# Patient Record
Sex: Female | Born: 1945 | Race: White | Hispanic: No | Smoking: Current every day smoker
Health system: Southern US, Community
[De-identification: ages and names within clinical notes are randomized; demographics above are authoritative.]

## PROBLEM LIST (undated history)

## (undated) DIAGNOSIS — M810 Age-related osteoporosis without current pathological fracture: Secondary | ICD-10-CM

## (undated) DIAGNOSIS — M1990 Unspecified osteoarthritis, unspecified site: Secondary | ICD-10-CM

## (undated) DIAGNOSIS — J449 Chronic obstructive pulmonary disease, unspecified: Secondary | ICD-10-CM

---

## 1998-01-05 ENCOUNTER — Other Ambulatory Visit: Admission: RE | Admit: 1998-01-05 | Discharge: 1998-01-05 | Payer: Self-pay | Admitting: Internal Medicine

## 1998-09-09 ENCOUNTER — Encounter: Payer: Self-pay | Admitting: Neurosurgery

## 1998-09-09 ENCOUNTER — Ambulatory Visit (HOSPITAL_COMMUNITY): Admission: RE | Admit: 1998-09-09 | Discharge: 1998-09-09 | Payer: Self-pay | Admitting: Neurosurgery

## 1999-05-20 ENCOUNTER — Encounter: Payer: Self-pay | Admitting: Internal Medicine

## 1999-05-20 ENCOUNTER — Ambulatory Visit (HOSPITAL_COMMUNITY): Admission: RE | Admit: 1999-05-20 | Discharge: 1999-05-20 | Payer: Self-pay | Admitting: Internal Medicine

## 1999-08-31 ENCOUNTER — Other Ambulatory Visit: Admission: RE | Admit: 1999-08-31 | Discharge: 1999-08-31 | Payer: Self-pay | Admitting: Internal Medicine

## 2000-04-14 ENCOUNTER — Ambulatory Visit (HOSPITAL_COMMUNITY): Admission: RE | Admit: 2000-04-14 | Discharge: 2000-04-14 | Payer: Self-pay | Admitting: Cardiology

## 2000-04-14 ENCOUNTER — Encounter: Payer: Self-pay | Admitting: Cardiology

## 2000-06-20 ENCOUNTER — Encounter: Payer: Self-pay | Admitting: Internal Medicine

## 2000-06-20 ENCOUNTER — Inpatient Hospital Stay (HOSPITAL_COMMUNITY): Admission: AD | Admit: 2000-06-20 | Discharge: 2000-06-22 | Payer: Self-pay | Admitting: Internal Medicine

## 2000-06-22 ENCOUNTER — Encounter: Payer: Self-pay | Admitting: Cardiology

## 2000-08-23 ENCOUNTER — Emergency Department (HOSPITAL_COMMUNITY): Admission: EM | Admit: 2000-08-23 | Discharge: 2000-08-23 | Payer: Self-pay | Admitting: Emergency Medicine

## 2000-08-23 ENCOUNTER — Encounter: Payer: Self-pay | Admitting: Emergency Medicine

## 2000-08-24 ENCOUNTER — Inpatient Hospital Stay: Admission: AD | Admit: 2000-08-24 | Discharge: 2000-08-28 | Payer: Self-pay | Admitting: Internal Medicine

## 2001-10-31 ENCOUNTER — Ambulatory Visit (HOSPITAL_COMMUNITY): Admission: RE | Admit: 2001-10-31 | Discharge: 2001-10-31 | Payer: Self-pay | Admitting: Pain Medicine

## 2001-10-31 ENCOUNTER — Encounter: Payer: Self-pay | Admitting: Pain Medicine

## 2002-03-06 ENCOUNTER — Encounter: Admission: RE | Admit: 2002-03-06 | Discharge: 2002-03-06 | Payer: Self-pay | Admitting: Internal Medicine

## 2002-03-06 ENCOUNTER — Encounter: Payer: Self-pay | Admitting: Internal Medicine

## 2002-04-17 ENCOUNTER — Ambulatory Visit (HOSPITAL_COMMUNITY): Admission: RE | Admit: 2002-04-17 | Discharge: 2002-04-17 | Payer: Self-pay | Admitting: Gastroenterology

## 2002-04-17 ENCOUNTER — Encounter (INDEPENDENT_AMBULATORY_CARE_PROVIDER_SITE_OTHER): Payer: Self-pay | Admitting: Specialist

## 2002-10-11 ENCOUNTER — Other Ambulatory Visit: Admission: RE | Admit: 2002-10-11 | Discharge: 2002-10-11 | Payer: Self-pay | Admitting: Obstetrics and Gynecology

## 2002-12-16 ENCOUNTER — Emergency Department (HOSPITAL_COMMUNITY): Admission: EM | Admit: 2002-12-16 | Discharge: 2002-12-16 | Payer: Self-pay | Admitting: Emergency Medicine

## 2003-01-31 ENCOUNTER — Emergency Department (HOSPITAL_COMMUNITY): Admission: EM | Admit: 2003-01-31 | Discharge: 2003-01-31 | Payer: Self-pay | Admitting: Emergency Medicine

## 2003-01-31 ENCOUNTER — Encounter: Payer: Self-pay | Admitting: Emergency Medicine

## 2003-02-17 ENCOUNTER — Ambulatory Visit (HOSPITAL_COMMUNITY): Admission: RE | Admit: 2003-02-17 | Discharge: 2003-02-17 | Payer: Self-pay | Admitting: Cardiology

## 2003-02-17 ENCOUNTER — Encounter: Payer: Self-pay | Admitting: Cardiology

## 2003-07-08 ENCOUNTER — Encounter: Admission: RE | Admit: 2003-07-08 | Discharge: 2003-07-08 | Payer: Self-pay | Admitting: Internal Medicine

## 2003-08-01 ENCOUNTER — Inpatient Hospital Stay (HOSPITAL_COMMUNITY): Admission: EM | Admit: 2003-08-01 | Discharge: 2003-08-01 | Payer: Self-pay | Admitting: Emergency Medicine

## 2004-06-20 ENCOUNTER — Ambulatory Visit (HOSPITAL_COMMUNITY): Admission: RE | Admit: 2004-06-20 | Discharge: 2004-06-20 | Payer: Self-pay | Admitting: Pain Medicine

## 2004-06-25 ENCOUNTER — Encounter: Admission: RE | Admit: 2004-06-25 | Discharge: 2004-06-25 | Payer: Self-pay | Admitting: Internal Medicine

## 2004-07-13 ENCOUNTER — Encounter: Admission: RE | Admit: 2004-07-13 | Discharge: 2004-07-13 | Payer: Self-pay | Admitting: Internal Medicine

## 2004-09-11 ENCOUNTER — Emergency Department (HOSPITAL_COMMUNITY): Admission: EM | Admit: 2004-09-11 | Discharge: 2004-09-11 | Payer: Self-pay | Admitting: Emergency Medicine

## 2004-12-30 ENCOUNTER — Encounter: Admission: RE | Admit: 2004-12-30 | Discharge: 2004-12-30 | Payer: Self-pay | Admitting: Neurology

## 2005-01-12 ENCOUNTER — Ambulatory Visit (HOSPITAL_COMMUNITY): Admission: RE | Admit: 2005-01-12 | Discharge: 2005-01-13 | Payer: Self-pay | Admitting: Neurology

## 2005-01-20 ENCOUNTER — Encounter: Payer: Self-pay | Admitting: Interventional Radiology

## 2005-02-17 ENCOUNTER — Inpatient Hospital Stay (HOSPITAL_COMMUNITY): Admission: RE | Admit: 2005-02-17 | Discharge: 2005-02-18 | Payer: Self-pay | Admitting: Interventional Radiology

## 2005-06-13 ENCOUNTER — Ambulatory Visit (HOSPITAL_COMMUNITY): Admission: RE | Admit: 2005-06-13 | Discharge: 2005-06-13 | Payer: Self-pay | Admitting: Interventional Radiology

## 2005-08-08 ENCOUNTER — Encounter: Admission: RE | Admit: 2005-08-08 | Discharge: 2005-08-08 | Payer: Self-pay | Admitting: Internal Medicine

## 2006-02-13 ENCOUNTER — Ambulatory Visit (HOSPITAL_COMMUNITY): Admission: RE | Admit: 2006-02-13 | Discharge: 2006-02-13 | Payer: Self-pay | Admitting: Interventional Radiology

## 2006-06-05 ENCOUNTER — Ambulatory Visit (HOSPITAL_COMMUNITY): Admission: RE | Admit: 2006-06-05 | Discharge: 2006-06-05 | Payer: Self-pay | Admitting: Interventional Radiology

## 2006-11-29 ENCOUNTER — Ambulatory Visit (HOSPITAL_COMMUNITY): Admission: RE | Admit: 2006-11-29 | Discharge: 2006-11-29 | Payer: Self-pay | Admitting: Interventional Radiology

## 2008-02-10 ENCOUNTER — Emergency Department (HOSPITAL_COMMUNITY): Admission: EM | Admit: 2008-02-10 | Discharge: 2008-02-10 | Payer: Self-pay | Admitting: Emergency Medicine

## 2008-09-29 ENCOUNTER — Ambulatory Visit (HOSPITAL_COMMUNITY): Admission: RE | Admit: 2008-09-29 | Discharge: 2008-09-29 | Payer: Self-pay | Admitting: Interventional Radiology

## 2008-10-08 ENCOUNTER — Ambulatory Visit: Payer: Self-pay | Admitting: Vascular Surgery

## 2008-10-08 ENCOUNTER — Ambulatory Visit (HOSPITAL_COMMUNITY): Admission: RE | Admit: 2008-10-08 | Discharge: 2008-10-08 | Payer: Self-pay | Admitting: Interventional Radiology

## 2008-10-08 ENCOUNTER — Encounter (INDEPENDENT_AMBULATORY_CARE_PROVIDER_SITE_OTHER): Payer: Self-pay | Admitting: Interventional Radiology

## 2008-12-02 ENCOUNTER — Encounter: Admission: RE | Admit: 2008-12-02 | Discharge: 2008-12-02 | Payer: Self-pay | Admitting: Gastroenterology

## 2009-04-11 ENCOUNTER — Emergency Department (HOSPITAL_COMMUNITY): Admission: EM | Admit: 2009-04-11 | Discharge: 2009-04-11 | Payer: Self-pay | Admitting: Emergency Medicine

## 2009-11-30 ENCOUNTER — Ambulatory Visit (HOSPITAL_COMMUNITY): Admission: RE | Admit: 2009-11-30 | Discharge: 2009-11-30 | Payer: Self-pay | Admitting: Interventional Radiology

## 2009-12-12 ENCOUNTER — Emergency Department (HOSPITAL_COMMUNITY): Admission: EM | Admit: 2009-12-12 | Discharge: 2009-12-12 | Payer: Self-pay | Admitting: Emergency Medicine

## 2010-06-26 ENCOUNTER — Encounter: Payer: Self-pay | Admitting: Internal Medicine

## 2010-06-27 ENCOUNTER — Encounter: Payer: Self-pay | Admitting: Nephrology

## 2010-06-27 ENCOUNTER — Encounter: Payer: Self-pay | Admitting: Interventional Radiology

## 2010-06-27 ENCOUNTER — Encounter: Payer: Self-pay | Admitting: Internal Medicine

## 2010-06-28 ENCOUNTER — Encounter: Payer: Self-pay | Admitting: Vascular Surgery

## 2010-08-10 ENCOUNTER — Institutional Professional Consult (permissible substitution): Payer: Self-pay | Admitting: Internal Medicine

## 2010-08-18 ENCOUNTER — Institutional Professional Consult (permissible substitution): Payer: Self-pay | Admitting: Internal Medicine

## 2010-08-23 LAB — CREATININE, SERUM
Creatinine, Ser: 1.03 mg/dL (ref 0.4–1.2)
GFR calc Af Amer: >60 mL/min (ref >60)

## 2010-09-06 ENCOUNTER — Other Ambulatory Visit: Payer: Self-pay | Admitting: Adult Health Nurse Practitioner

## 2010-09-06 DIAGNOSIS — R229 Localized swelling, mass and lump, unspecified: Secondary | ICD-10-CM

## 2010-09-06 DIAGNOSIS — M545 Low back pain: Secondary | ICD-10-CM

## 2010-09-07 ENCOUNTER — Ambulatory Visit
Admission: RE | Admit: 2010-09-07 | Discharge: 2010-09-07 | Disposition: A | Discharge status: Home / Self Care | Payer: Medicare Other | Source: Ambulatory Visit | Attending: Adult Health Nurse Practitioner | Admitting: Adult Health Nurse Practitioner

## 2010-09-07 DIAGNOSIS — M545 Low back pain: Secondary | ICD-10-CM

## 2010-09-07 DIAGNOSIS — R229 Localized swelling, mass and lump, unspecified: Secondary | ICD-10-CM

## 2010-09-15 LAB — BASIC METABOLIC PANEL
BUN: 11 mg/dL (ref 6–23)
Calcium: 8.8 mg/dL (ref 8.4–10.5)
Chloride: 106 mEq/L (ref 96–112)
Potassium: 3.9 mEq/L (ref 3.5–5.1)
Sodium: 139 mEq/L (ref 135–145)

## 2010-09-15 LAB — CBC
HCT: 34.8 % — ABNORMAL LOW (ref 36.0–46.0)
Platelets: 217 10*3/uL (ref 150–400)
RDW: 13.4 % (ref 11.5–15.5)
WBC: 5.5 10*3/uL (ref 4.0–10.5)

## 2010-09-15 LAB — APTT: aPTT: 24 seconds (ref 24–37)
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
## 2010-10-22 NOTE — Op Note (Signed)
NAME:  [name] [name], [name] [name] NO.:  [id number]   MEDICAL RECORD NO.:  [id number]                    PATIENT TYPE:   LOCATION:                                       FACILITY:   PHYSICIAN:  Anselmo Rod, M.D.               DATE OF BIRTH:  1945-12-16   DATE OF PROCEDURE:  04/17/2002  DATE OF DISCHARGE:                                 OPERATIVE REPORT   PROCEDURE:  Colonoscopy with snare polypectomy times three.   ENDOSCOPIST:  Anselmo Rod, M.D.   INSTRUMENT USED:  Olympus video colonoscope (adjustable pediatric scope).   INDICATIONS FOR PROCEDURE:  Guaiac positive stools, abnormal weight loss and  ongoing constipation in a 65 year-old white female with a Alcantar-standing  history of narcotic use secondary to back pain and injured vertebral disk.  Rule out colonic polyps, AVMs, masses or hemorrhoids.   PREPROCEDURE PREPARATION:  Informed consent was procured from the patient.  The patient was fasted for eight hours prior to the procedure.  She had a  bottle of MiraLax and two 32 ounce bottles of Gator-Ade prior to the  procedure.   PREPROCEDURE PHYSICAL EXAM:  VITAL SIGNS:  The patient had stable vital  signs.  NECK:  Supple.  CHEST:  Clear to auscultation.  HEART:  S1 and S2.  ABDOMEN:  Soft with normal bowel sounds.   DESCRIPTION OF PROCEDURE:  The patient was placed in the left lateral  decubitus position and sedated was used with 30 mg of Demerol and 1 mg of  Versed intravenously.  Once the patient was adequately sedated and  maintained on low flow oxygen and continuous cardiac monitoring, the Olympus  video colonoscope was advanced into the rectum to the cecum.  The patient  required large doses of medications.  She had an esophagogastroduodenoscopy  prior to the colonoscopy and total of 180 mg of Demerol and 18 mg of Versed  for both the EGD and colonoscopy.  The procedure was completed up to the  terminal ileum which appeared normal.  The  appendiceal and ileocecal valve  were clearly visualized and photographed.  Three polyps were snared; two  from the rectosigmoid and one in the retrosigmoid area.  One was  pedunculated and one was sessile.  One was snared from 60 cm which was a  sessile polyp.  There was no evidence of diverticulosis.  Small internal  hemorrhoids were seen on retroflexion.  The patient tolerated the procedure  without complications.   IMPRESSION:  1. Three colonic polyps (see description above).  2. No evidence of diverticulosis.  3. Small non-bleeding internal hemorrhoids seen on retroflexion.    RECOMMENDATIONS:  1. Await pathology results.  2. Avoid all non-steroidals including aspirin for the next four weeks.  3. Outpatient follow-up in the next two weeks for further recommendations.  Anselmo Rod, M.D.    JNM/MEDQ  D:  04/17/2002  T:  04/17/2002  Job:  [id number]   cc:   Merlene Laughter. Renae Gloss, M.D.
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
## 2010-10-22 NOTE — Cardiovascular Report (Signed)
NAME:  [name] [name], [name] [name]                            ACCOUNT NO.:  [id number]   MEDICAL RECORD NO.:  [id number]                   PATIENT TYPE:  INP   LOCATION:  2017                                 FACILITY:  [facility]   PHYSICIAN:  Mohan N. Sharyn Lull, M.D.              DATE OF BIRTH:  [birth year]   DATE OF PROCEDURE:  08/01/2003  DATE OF DISCHARGE:  08/01/2003                              CARDIAC CATHETERIZATION   PROCEDURE:  1. Left cardiac cath with selected left and right coronary angiography.  2. Left ventriculography via right groin using Judkins technique.   INDICATION FOR THE PROCEDURE:  [name] [name] is a 65 year old white female with  past medical history significant for hypertension, emphysema, tobacco abuse,  history of fibromyalgia, history of peptic ulcer disease and GERD, family  history of coronary artery disease, depression, degenerative joint disease.  She came to the ER complaining of squeezing chest pain radiating to the left  arm and back, grade 9/10, received 2 sublingual nitro in ER with relief of  chest pain.  The patient also complains of exertional dyspnea with minimal  exertion and feeling weak and tired and fatigued.  Denies PND, orthopnea,  leg swelling, denies palpitation, lightheadedness, or syncope.  Patient also  complains of chronic back pain and is on multiple narcotics.   PAST MEDICAL HISTORY:  1. As above.  2. Recent pneumonia approximately 2-3 weeks ago.   PAST SURGICAL HISTORY:  1. Hernia surgery in the past.  2. Tonsillectomy many years ago.  3. Hysterectomy in the past.   ALLERGIES:  She is allergic to PENICILLIN.   MEDICATION AT HOME:  1. Fentanyl patch 50 mcg per 72 hours.  2. Oxycodone.  3. Neurontin.  4. Xanax.  5. Zanaflex.  6. Nexium.  7. Trazodone.  8. Advair.  9. Nitrostat sublingual p.r.n.   SOCIAL HISTORY:  She is widowed, retired housewife, smokes 1-1/2 pack-per-  day for 36 years and continues to smoke.  No history of alcohol  abuse.  Father died of CVA.  He was hypertensive.  He also had cancer at the age of  59.  Mother died at the age of 65, and she was hypertensive.  She also had  stroke.  One brother had MI in his 62s.  One sister had cancer.   PHYSICAL EXAMINATION:  GENERAL:  She is alert, awake, oriented x 3, in no  acute distress.  VITAL SIGNS:  Blood pressure is 112/72, pulse 76 regular.  HEENT:  Conjunctivae pink.  NECK:  Supple, no JVD, no bruit.  LUNGS:  Clear to auscultation without rhonchi or rales.  CARDIOVASCULAR:  S1, S2 normal.  There is soft systolic murmur, and there  was no [place] gallop.  ABDOMEN:  Soft.  Bowel sounds are present, nontender.  EXTREMITIES:  There is no cyanosis, clubbing, or edema.   Her EKG done in the ER showed normal sinus rhythm with  no acute ischemic  changes.  Repeat EKG showed nonspecific T-wave changes.  Her three sets of  cardiac enzymes were negative.  The patient was started on subcu Lovenox and  nitrates.  The patient had not had any further episodes of chest pain during  the hospital stay.  Discussed with patient and her daughter regarding  various options of treatment, i.e., noninvasive stress testing versus left  cath, possible PTCA, stenting, and risks of death, MI, stroke, need for  emergency CABG, risk of restenosis, local vascular complications, etc., and  consented for PCI.   DESCRIPTION OF PROCEDURE:  After obtaining the informed consent, patient was  brought to the cath lab and was placed on fluoroscopy table.  Right groin  was prepped and draped in usual fashion.  Then 2% Xylocaine was used for  local anesthesia in the right groin.  Thin-wall needle and 6 French arterial  sheath was placed.  The sheath was aspirated and flushed.  Next, 6 French  left Judkins catheter was advanced over the wire under fluoroscopic guidance  up to the ascending aorta.  Wire was pulled out.  The catheter was aspirated  and connected to the manifold.  Catheter was further  advanced and engaged  into left coronary ostium.  Multiple views of the left system were taken.  Next, the catheter was disengaged and was pulled out over the wire and was  replaced with 6 French right Judkins catheter which was advanced over the  wire under fluoroscopic guidance up to the ascending aorta.  Wire was pulled  out.  The catheter was aspirated and connected to the manifold.  The  catheter was further advanced and engaged into right coronary ostium.  Multiple views of the right system were taken.  Next, the catheter was  disengaged and was pulled out over the wire and was replaced with 6 French  pigtail catheter which was advanced over the wire under fluoroscopic  guidance up to the ascending aorta.  Wire was pulled out; the catheter was  aspirated and connected to the manifold.  The catheter was further advanced  across the aortic valve into the LV.  LV pressures were recorded.  Next,  left ventriculography was done in 30-degree RAO position.  Post angiographic  pressures were recorded from LV, and then pull-back pressures were recorded  from the aorta.  There was no gradient across the aortic valve.  Next, the  pigtail catheter was pulled out over the wire.  Sheaths were aspirated and  flushed.   FINDINGS:  1. LV showed good LV systolic function.  2. Left main was patent.  3. LAD has 10-15% mid stenosis.  4. Diagonal 1 was small which was patent.  5. Diagonal 2 was very small which was patent.  6. Ramus was very small which was also patent.  7. Left circumflex was patent.  OM 1-2, OM 3 were patent.  RCA was patent.   Arteriotomy was closed with Perclose without complications.  The patient  tolerated the procedure well.  The patient was transferred to recovery room  in stable condition.                                               Eduardo Osier. Sharyn Lull, M.D.   MNH/MEDQ  D:  08/01/2003  T:  08/04/2003  Job:  [id number]   cc:  Anselmo Rod, M.D.  [address]  [address]  [place]  [place] [address]  Fax: [phone]   Olene Craven, M.D.  [address]  [address]  [place]  [place] [address]  Fax: [phone]   Cath Lab

## 2010-10-22 NOTE — Discharge Summary (Signed)
NAME:  [name] [name], [name] [name]                            ACCOUNT NO.:  [id number]   MEDICAL RECORD NO.:  [id number]                   PATIENT TYPE:  INP   LOCATION:  2017                                 FACILITY:  [facility]   PHYSICIAN:  Mohan N. Sharyn Lull, M.D.              DATE OF BIRTH:  04/07/1946   DATE OF ADMISSION:  07/31/2003  DATE OF DISCHARGE:  08/01/2003                                 DISCHARGE SUMMARY   ADMITTING DIAGNOSES:  1. Recurrent chest pain, rule out myocardial infarction.  2. Hypertension.  3. Emphysema.  4. Tobacco abuse.  5. Degenerative joint disease.  6. Depression.  7. Positive family history of coronary artery disease.  8. History of peptic ulcer disease.  9. Gastroesophageal reflux disease.  10.      Fibromyalgia.  11.      Chronic back pain.   DISCHARGE DIAGNOSES:  1. Status post recurrent chest pain.  2. Mild coronary artery disease.  3. Hypertension.  4. Emphysema.  5. Tobacco abuse.  6. Degenerative joint disease.  7. Depression.  8. Positive family history of coronary artery disease.  9. Peptic ulcer disease.  10.      History of gastroesophageal reflux disease.  11.      Fibromyalgia.  12.      Chronic back pain.   DISCHARGE HOME MEDICATIONS:  1. Tiazac 120 mg p.o. q.a.m.  2. Nitrostat sublingual p.r.n.  3. Nexium 40 mg p.o. b.i.d.  4. Trazodone.  5. Neurontin.  6. Oxycodone.  7. Fentanyl patch as before.  (The patient has been advised to wean off the narcotics, to which she and  her daughter agree.)   DIET:  Low salt, low cholesterol.   ACTIVITY:  Avoid heavy lifting, pushing, or pulling for 48 hours post  cardiac catheterization, and Perclose instructions have been given.   FOLLOW UP:  1. Follow with me in 1 week.  2. Follow with primary M.D. and Dr. Loreta Ave as scheduled.   BRIEF HISTORY AND HOSPITAL COURSE:  [name] [name] is a 65 year old white female  with a past medical history significant for hypertension, emphysema, tobacco  abuse,  fibromyalgia, peptic ulcer disease, GERD, positive family history of  coronary artery disease, depression.  She came to the ER complaining of  squeezing chest pain radiating to the left arm and back, grade 9/10.  She  received sublingual nitroglycerin x2 in the emergency room with relief of  chest pain.  The patient also complains of exertional dyspnea with minimal  exertion, and feeling tired and fatigued.  Denies PND, orthopnea, leg  swelling.  Denies palpitations, lightheadedness, or syncope.  The patient  also complaint of chronic back pain.  Denies any relation of chest pain to  coughing.  The patient states that she had pneumonia approximately 2-3 weeks  ago.   PAST MEDICAL HISTORY:  As above.   PAST SURGICAL HISTORY:  1. She had  herniated disk surgery in the past.  2. Tonsillectomy in the past.  3. Hysterectomy in the past.   ALLERGIES:  No known drug allergies.   MEDICATIONS:  She is on multiple medications -  1. Fentanyl.  2. Oxycodone.  3. Neurontin.  4. Xanax.  5. Zanaflex.  6. Nexium.  7. Trazodone.  8. Advair.  9. Nitrostat sublingual p.r.n.   SOCIAL HISTORY:  She is a widowed retired housewife.  She smokes 1-1/2 packs  per day for 36 years.  No history of alcohol abuse.   FAMILY HISTORY:  Father died of stroke.  He was hypertensive.  He had also  cancer, and died at the age of 83.  Mother died at the age of 46.  She was  hypertensive.  She also had a stroke.  One brother had an MI in his 2's.  One sister had cancer.   PHYSICAL EXAMINATION:  GENERAL:  She was alert, awake, oriented x3, in no  acute distress.  VITAL SIGNS:  Blood pressure was 112/72, pulse 76, conjunctivae pink.  NECK:  No JVD, no bruit.  LUNGS:  Clear to auscultation without rhonchi, rales, or wheezing.  CARDIOVASCULAR:  S1 and S2 were normal.  There was no S3, gallop.  There was  a soft systolic murmur.  ABDOMEN:  Soft.  Bowel sounds were present.  Nontender.  EXTREMITIES:  There is no  clubbing, cyanosis, or edema.   LABORATORY DATA:  Sodium 138, potassium 3.6, chloride 104, BUN 10, glucose  98, creatinine 1.0, hemoglobin 11.7, hematocrit 34.8.  Three sets of cardiac  enzymes point of care were negative.  EKG showed normal sinus rhythm.  Repeat EKG showed nonspecific T wave changes.   BRIEF HOSPITAL COURSE:  The patient was admitted to the telemetry unit.  MI  was ruled out by serial enzymes and EKG.  Discussed with patient and her  daughter regarding various options of treatment (i.e. noninvasive stress  testing versus left catheterization, possible PTCA and stenting, __________,  stroke, need for emergency CABG, risk of restenosis, local vascular  punctations, __________).  The patient opted for invasive therapy.  The  patient underwent left catheterization today, which showed mild coronary  artery disease.  The patient did not have any further episodes of chest pain  during the hospital stay.  Her groin is stable.  The patient will be  ambulated in the hallway later this evening if her groin is stable, and, if  she remains hemodynamically stable, will be discharged home on the above  medications, and will be followed up in my office in 1 week.                                                Eduardo Osier. Sharyn Lull, M.D.    MNH/MEDQ  D:  08/01/2003  T:  08/02/2003  Job:  [id number]   cc:   Anselmo Rod, M.D.  [address]  [address]  [place]  [place] [address]  Fax: [phone]   Olene Craven, M.D.  [address]  [address]  [place]  [place] [address]  Fax: [phone]
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
## 2010-10-22 NOTE — Consult Note (Signed)
NAME:  [name] [name], [name] [name]                  ACCOUNT NO.:  [id number]   MEDICAL RECORD NO.:  [id number]           PATIENT TYPE:   LOCATION:                               FACILITY:  [facility]   PHYSICIAN:  Sanjeev K. Deveshwar, M.D.DATE OF BIRTH:  [birth year]   DATE OF CONSULTATION:  01/20/2005  DATE OF DISCHARGE:                                   CONSULTATION   BRIEF HISTORY:  This is a pleasant 65 year old female who was evaluated by  Dr. Orlin Hilding in July of this year for chronic pain.  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  The  patient was referred to Dr. 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  There was no other aneurysm noted.  The patient returns today  accompanied by her sister to discuss possible treatment options for the  aneurysm.   Dr. Corliss Skains spent greater than 40 minutes with the patient and her sister  discussing treatment options, as well as risks and benefits.  Dr. Corliss Skains  did feel that the aneurysm would be amenable to coiling.  He also discussed  other options such as watching and waiting, as well as possible open  surgical clipping to be performed by a neurosurgeon.  Apparently, Dr.  Orlin Hilding had suggested to the patient that she obtain a second opinion from a  neurosurgeon; however, the patient apparently had declined neurosurgical  consultation.   The patient's arteriogram study was reviewed with the patient and her  sister, and the patient stated that she would like to proceed with coiling  of the aneurysm as soon as possible.  Dr. Corliss Skains recommended that the  patient try to quit smoking as soon as possible, and to be sure that her  blood pressure is under optimal control.  He felt that the aneurysm coiling  could probably be performed in early September.  The patient  will be  contacted tomorrow with possible dates for the intervention.   Again, greater than 40 minutes were spent with the patient and her sister  today discussing options, risks, and benefits of the procedures.      Delton See, P.A.    ______________________________  Grandville Silos. Corliss Skains, M.D.    DR/MEDQ  D:  01/20/2005  T:  01/20/2005  Job:  [id number]   cc:   Santina Evans A. Orlin Hilding, M.D.  Fax: [phone]   Ewing Schlein  [address]  [place]  [place] [address]  Fax: [phone]   Merlene Laughter. Renae Gloss, M.D.  [address]  [address]  [place]  [place] [address]  Fax: [phone]
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
## 2010-10-22 NOTE — Op Note (Signed)
   NAME:  [name] [name], [name] [name]                         ACCOUNT NO.:  [id number]   MEDICAL RECORD NO.:  [id number]                   PATIENT TYPE:  AMB   LOCATION:  ENDO                                 FACILITY:  [facility]   PHYSICIAN:  Anselmo Rod, M.D.               DATE OF BIRTH:  [birth year]   DATE OF PROCEDURE:  04/17/2002  DATE OF DISCHARGE:                                 OPERATIVE REPORT   PROCEDURE PERFORMED:  Esophagogastroduodenoscopy.   ENDOSCOPIST:  Charna Elizabeth, M.D.   INSTRUMENT USED:  Olympus video panendoscope.   INDICATIONS FOR PROCEDURE:  Guaiac positive stool and abnormal weight loss  in a 65 year-old white female.  Rule out ulcer disease, esophagitis,  gastritis, masses, polyps, etc.   PREPROCEDURE PREPARATION:  Informed consent was procured from the patient.  The patient was fasted for eight hours prior to the procedure.   PREPROCEDURE PHYSICAL:  The patient had stable vital signs.  Neck supple,  chest clear to auscultation.  S1, S2 regular.  Abdomen soft with normal  bowel sounds.   DESCRIPTION OF PROCEDURE:  The patient was placed in the left lateral  decubitus position and sedated with 150 mg of Demerol and 17 mg of Versed  intravenously.  Once the patient was adequately sedated and maintained on  low-flow oxygen and continuous cardiac monitoring, the Olympus video  panendoscope was advanced through the mouth piece over the tongue into the  esophagus under direct vision.  The entire esophagus appeared normal with no  evidence of ring, stricture, masses, esophagitis or Barrett's mucosa.  The  scope was then advanced to the stomach.  There was mild diffuse gastritis  seen throughout the gastric mucosa.  No  hiatal hernia was noted on  retroflexion.  The proximal small bowel appeared normal.   IMPRESSION:  1. Essentially unrevealing esophagogastroduodenoscopy with regard to     patient's guaiac positive stool and abnormal weight loss.  2. Mild diffuse gastritis,  no ulcers seen.   RECOMMENDATIONS:  Proceed with colonoscopy at this time.                                                 Anselmo Rod, M.D.    JNM/MEDQ  D:  04/17/2002  T:  04/17/2002  Job:  [id number]   cc:   Merlene Laughter. Renae Gloss, M.D.

## 2010-10-22 NOTE — Discharge Summary (Signed)
[facility]  Patient:    [name] [name], [name] [name]                         MRN: [id number] Adm. Date:  [id number] Disc. Date: [id number] Attending:  Devoria Albe CC:         Adelene Amas. Williford, M.D.   Discharge Summary  DATE OF BIRTH:  1945/11/06  DISCHARGE DIAGNOSES: 1. Major depression with psychotic features, resolved. 2. Chronic obstructive pulmonary disease. 3. Chronic pain syndrome.  CONSULTATIONS:  Dr. Jeanie Sewer, psychiatry.  PROCEDURES:  CT scan of the head - negative.  DISCHARGE MEDICATIONS: 1. Risperdal 1 mg p.o. q.h.s. 2. Prozac 10 mg q.d. x 1 week then 20 mg p.o. q.d. 3. Neurontin 300 mg p.o. b.i.d. 4. Duragesic patch 50 mcg one patch q.3d. 5. Premarin 1.25 mg p.o. q.d.  HOSPITAL COURSE:  The patient was admitted on August 24, 2000, after experiencing a 12-hour history of confusion and delirium, found wandering around the house.  She was admitted for observation.  CT scan of the head was negative.  Initial laboratories were unremarkable.  The patient had loose associations, word salad, and was disoriented to person, place, and time. Psychiatric consult was garnered with Dr. Jeanie Sewer.  The patient was diagnosed with major depression with psychotic features in lieu of severe social stressors including sister with breast cancer undergoing treatment and a daughter who is getting married in the near future with the patient and daughter having an estranged relationship.  The patient was started on Risperdal while in the hospital with good resolution of her psychotic features.  She is being discharged in stable condition with resolution of her psychotic features.  DISCHARGE FOLLOWUP:  She is to follow up with intensive outpatient psychiatric treatment which will be arranged by case management upon discharge.  CONDITION UPON DISCHARGE:  Stable. DD:  08/28/00 TD:  08/28/00 Job: [id number]

## 2010-11-05 ENCOUNTER — Other Ambulatory Visit: Payer: Self-pay | Admitting: Internal Medicine

## 2010-11-05 DIAGNOSIS — R935 Abnormal findings on diagnostic imaging of other abdominal regions, including retroperitoneum: Secondary | ICD-10-CM

## 2010-11-12 ENCOUNTER — Ambulatory Visit
Admission: RE | Admit: 2010-11-12 | Discharge: 2010-11-12 | Disposition: A | Discharge status: Home / Self Care | Payer: Medicare Other | Source: Ambulatory Visit | Attending: Internal Medicine | Admitting: Internal Medicine

## 2010-11-12 DIAGNOSIS — R935 Abnormal findings on diagnostic imaging of other abdominal regions, including retroperitoneum: Secondary | ICD-10-CM

## 2010-11-12 MED ORDER — GADOBENATE DIMEGLUMINE 529 MG/ML IV SOLN
10.0000 mL | Freq: Once | INTRAVENOUS | Status: AC | PRN
  Administered 2010-11-12: 10 mL via INTRAVENOUS

## 2010-11-19 ENCOUNTER — Emergency Department (HOSPITAL_COMMUNITY)
Admission: EM | Admit: 2010-11-19 | Discharge: 2010-11-19 | Disposition: A | Discharge status: Home / Self Care | Payer: Medicare Other | Attending: Emergency Medicine | Admitting: Emergency Medicine

## 2010-11-19 DIAGNOSIS — J449 Chronic obstructive pulmonary disease, unspecified: Secondary | ICD-10-CM | POA: Insufficient documentation

## 2010-11-19 DIAGNOSIS — M810 Age-related osteoporosis without current pathological fracture: Secondary | ICD-10-CM | POA: Insufficient documentation

## 2010-11-19 DIAGNOSIS — G8929 Other chronic pain: Secondary | ICD-10-CM | POA: Insufficient documentation

## 2010-11-19 DIAGNOSIS — J4489 Other specified chronic obstructive pulmonary disease: Secondary | ICD-10-CM | POA: Insufficient documentation

## 2010-11-19 DIAGNOSIS — K219 Gastro-esophageal reflux disease without esophagitis: Secondary | ICD-10-CM | POA: Insufficient documentation

## 2010-11-19 DIAGNOSIS — M25559 Pain in unspecified hip: Secondary | ICD-10-CM | POA: Insufficient documentation

## 2010-12-23 ENCOUNTER — Other Ambulatory Visit (HOSPITAL_COMMUNITY): Payer: Self-pay | Admitting: Interventional Radiology

## 2010-12-23 DIAGNOSIS — I729 Aneurysm of unspecified site: Secondary | ICD-10-CM

## 2011-01-04 ENCOUNTER — Ambulatory Visit (HOSPITAL_COMMUNITY): Payer: Medicare Other

## 2011-01-04 ENCOUNTER — Ambulatory Visit (HOSPITAL_COMMUNITY): Admission: RE | Admit: 2011-01-04 | Payer: Medicare Other | Source: Ambulatory Visit

## 2011-01-21 ENCOUNTER — Ambulatory Visit (HOSPITAL_COMMUNITY): Admission: RE | Admit: 2011-01-21 | Payer: Medicare Other | Source: Ambulatory Visit

## 2011-02-03 ENCOUNTER — Ambulatory Visit (HOSPITAL_COMMUNITY): Payer: Medicare Other

## 2011-02-14 ENCOUNTER — Ambulatory Visit (HOSPITAL_COMMUNITY)
Admission: RE | Admit: 2011-02-14 | Discharge: 2011-02-14 | Disposition: A | Discharge status: Home / Self Care | Payer: Medicare Other | Source: Ambulatory Visit | Attending: Interventional Radiology | Admitting: Interventional Radiology

## 2011-02-14 DIAGNOSIS — I671 Cerebral aneurysm, nonruptured: Secondary | ICD-10-CM | POA: Insufficient documentation

## 2011-02-14 DIAGNOSIS — Z09 Encounter for follow-up examination after completed treatment for conditions other than malignant neoplasm: Secondary | ICD-10-CM | POA: Insufficient documentation

## 2011-02-14 DIAGNOSIS — I729 Aneurysm of unspecified site: Secondary | ICD-10-CM

## 2011-02-14 DIAGNOSIS — G319 Degenerative disease of nervous system, unspecified: Secondary | ICD-10-CM | POA: Insufficient documentation

## 2011-02-14 DIAGNOSIS — I6789 Other cerebrovascular disease: Secondary | ICD-10-CM | POA: Insufficient documentation

## 2011-02-14 LAB — BUN: BUN: 10 mg/dL (ref 6–23)

## 2011-02-14 LAB — CREATININE, SERUM
Creatinine, Ser: 0.82 mg/dL (ref 0.50–1.10)
GFR calc Af Amer: >60 mL/min (ref >60)
GFR calc non Af Amer: >60 mL/min (ref >60)

## 2011-02-14 MED ORDER — GADOBENATE DIMEGLUMINE 529 MG/ML IV SOLN
10.0000 mL | Freq: Once | INTRAVENOUS | Status: AC
  Administered 2011-02-14: 10 mL via INTRAVENOUS

## 2011-03-23 LAB — BASIC METABOLIC PANEL
BUN: 5 — ABNORMAL LOW
Creatinine, Ser: 0.81
GFR calc non Af Amer: >60
Glucose, Bld: 103 — ABNORMAL HIGH

## 2011-03-23 LAB — CBC
Platelets: 272
RDW: 13.3
WBC: 7.4

## 2011-03-23 LAB — APTT: aPTT: 23 — ABNORMAL LOW

## 2011-03-23 LAB — PROTIME-INR: Prothrombin Time: 13.6

## 2011-05-27 ENCOUNTER — Other Ambulatory Visit: Payer: Self-pay | Admitting: Internal Medicine

## 2011-05-27 DIAGNOSIS — N644 Mastodynia: Secondary | ICD-10-CM

## 2011-06-09 DIAGNOSIS — IMO0002 Reserved for concepts with insufficient information to code with codable children: Secondary | ICD-10-CM | POA: Diagnosis not present

## 2011-06-15 DIAGNOSIS — IMO0002 Reserved for concepts with insufficient information to code with codable children: Secondary | ICD-10-CM | POA: Diagnosis not present

## 2011-06-22 DIAGNOSIS — M169 Osteoarthritis of hip, unspecified: Secondary | ICD-10-CM | POA: Diagnosis not present

## 2011-06-27 DIAGNOSIS — M169 Osteoarthritis of hip, unspecified: Secondary | ICD-10-CM | POA: Diagnosis not present

## 2011-06-27 DIAGNOSIS — IMO0002 Reserved for concepts with insufficient information to code with codable children: Secondary | ICD-10-CM | POA: Diagnosis not present

## 2011-06-28 ENCOUNTER — Other Ambulatory Visit (HOSPITAL_COMMUNITY): Payer: Self-pay | Admitting: Orthopedic Surgery

## 2011-06-28 DIAGNOSIS — S73199A Other sprain of unspecified hip, initial encounter: Secondary | ICD-10-CM

## 2011-06-28 DIAGNOSIS — M25552 Pain in left hip: Secondary | ICD-10-CM

## 2011-07-01 ENCOUNTER — Other Ambulatory Visit (HOSPITAL_COMMUNITY): Payer: Medicare Other

## 2011-07-01 ENCOUNTER — Encounter (HOSPITAL_COMMUNITY): Payer: Medicare Other

## 2011-07-04 ENCOUNTER — Other Ambulatory Visit (HOSPITAL_COMMUNITY): Payer: Medicare Other

## 2011-07-04 ENCOUNTER — Encounter (HOSPITAL_COMMUNITY): Payer: Medicare Other

## 2011-07-13 ENCOUNTER — Encounter (HOSPITAL_COMMUNITY)
Admission: RE | Admit: 2011-07-13 | Discharge: 2011-07-13 | Disposition: A | Discharge status: Home / Self Care | Payer: Medicare Other | Source: Ambulatory Visit | Attending: Orthopedic Surgery | Admitting: Orthopedic Surgery

## 2011-07-13 ENCOUNTER — Ambulatory Visit (HOSPITAL_COMMUNITY)
Admission: RE | Admit: 2011-07-13 | Discharge: 2011-07-13 | Disposition: A | Discharge status: Home / Self Care | Payer: Medicare Other | Source: Ambulatory Visit | Attending: Orthopedic Surgery | Admitting: Orthopedic Surgery

## 2011-07-13 DIAGNOSIS — S73199A Other sprain of unspecified hip, initial encounter: Secondary | ICD-10-CM

## 2011-07-13 DIAGNOSIS — M25559 Pain in unspecified hip: Secondary | ICD-10-CM | POA: Insufficient documentation

## 2011-07-13 DIAGNOSIS — M25552 Pain in left hip: Secondary | ICD-10-CM

## 2011-07-13 MED ORDER — TECHNETIUM TC 99M MEDRONATE IV KIT
25.0000 | PACK | Freq: Once | INTRAVENOUS | Status: AC | PRN
  Administered 2011-07-13: 24.5 via INTRAVENOUS

## 2011-07-14 DIAGNOSIS — IMO0002 Reserved for concepts with insufficient information to code with codable children: Secondary | ICD-10-CM | POA: Diagnosis not present

## 2011-07-20 DIAGNOSIS — M169 Osteoarthritis of hip, unspecified: Secondary | ICD-10-CM | POA: Diagnosis not present

## 2011-08-03 ENCOUNTER — Encounter
Payer: Medicare Other | Attending: Physical Medicine and Rehabilitation | Admitting: Physical Medicine and Rehabilitation

## 2011-08-11 DIAGNOSIS — IMO0002 Reserved for concepts with insufficient information to code with codable children: Secondary | ICD-10-CM | POA: Diagnosis not present

## 2011-08-24 DIAGNOSIS — R209 Unspecified disturbances of skin sensation: Secondary | ICD-10-CM | POA: Diagnosis not present

## 2011-08-24 DIAGNOSIS — F411 Generalized anxiety disorder: Secondary | ICD-10-CM | POA: Diagnosis not present

## 2011-08-24 DIAGNOSIS — Z0000 Encounter for general adult medical examination without abnormal findings: Secondary | ICD-10-CM | POA: Diagnosis not present

## 2011-08-24 DIAGNOSIS — R079 Chest pain, unspecified: Secondary | ICD-10-CM | POA: Diagnosis not present

## 2011-08-24 DIAGNOSIS — J449 Chronic obstructive pulmonary disease, unspecified: Secondary | ICD-10-CM | POA: Diagnosis not present

## 2011-08-24 DIAGNOSIS — K219 Gastro-esophageal reflux disease without esophagitis: Secondary | ICD-10-CM | POA: Diagnosis not present

## 2011-08-24 DIAGNOSIS — Z79899 Other long term (current) drug therapy: Secondary | ICD-10-CM | POA: Diagnosis not present

## 2011-09-06 DIAGNOSIS — J449 Chronic obstructive pulmonary disease, unspecified: Secondary | ICD-10-CM | POA: Diagnosis not present

## 2011-09-06 DIAGNOSIS — R0989 Other specified symptoms and signs involving the circulatory and respiratory systems: Secondary | ICD-10-CM | POA: Diagnosis not present

## 2011-09-06 DIAGNOSIS — R0609 Other forms of dyspnea: Secondary | ICD-10-CM | POA: Diagnosis not present

## 2011-09-06 DIAGNOSIS — G8929 Other chronic pain: Secondary | ICD-10-CM | POA: Diagnosis not present

## 2011-09-06 DIAGNOSIS — R079 Chest pain, unspecified: Secondary | ICD-10-CM | POA: Diagnosis not present

## 2011-09-06 DIAGNOSIS — Z87891 Personal history of nicotine dependence: Secondary | ICD-10-CM | POA: Diagnosis not present

## 2011-09-08 DIAGNOSIS — IMO0002 Reserved for concepts with insufficient information to code with codable children: Secondary | ICD-10-CM | POA: Diagnosis not present

## 2011-10-06 DIAGNOSIS — IMO0002 Reserved for concepts with insufficient information to code with codable children: Secondary | ICD-10-CM | POA: Diagnosis not present

## 2011-10-25 DIAGNOSIS — K1120 Sialoadenitis, unspecified: Secondary | ICD-10-CM | POA: Diagnosis not present

## 2011-10-25 DIAGNOSIS — Z79899 Other long term (current) drug therapy: Secondary | ICD-10-CM | POA: Diagnosis not present

## 2011-11-10 DIAGNOSIS — IMO0002 Reserved for concepts with insufficient information to code with codable children: Secondary | ICD-10-CM | POA: Diagnosis not present

## 2011-11-24 DIAGNOSIS — J449 Chronic obstructive pulmonary disease, unspecified: Secondary | ICD-10-CM | POA: Diagnosis not present

## 2011-12-07 DIAGNOSIS — IMO0002 Reserved for concepts with insufficient information to code with codable children: Secondary | ICD-10-CM | POA: Diagnosis not present

## 2011-12-07 DIAGNOSIS — M47812 Spondylosis without myelopathy or radiculopathy, cervical region: Secondary | ICD-10-CM | POA: Diagnosis not present

## 2011-12-07 DIAGNOSIS — M25519 Pain in unspecified shoulder: Secondary | ICD-10-CM | POA: Diagnosis not present

## 2011-12-07 DIAGNOSIS — M47817 Spondylosis without myelopathy or radiculopathy, lumbosacral region: Secondary | ICD-10-CM | POA: Diagnosis not present

## 2011-12-22 ENCOUNTER — Ambulatory Visit
Admission: RE | Admit: 2011-12-22 | Discharge: 2011-12-22 | Disposition: A | Discharge status: Home / Self Care | Payer: Medicare Other | Source: Ambulatory Visit | Attending: Internal Medicine | Admitting: Internal Medicine

## 2011-12-22 DIAGNOSIS — N644 Mastodynia: Secondary | ICD-10-CM | POA: Diagnosis not present

## 2012-01-12 DIAGNOSIS — M47817 Spondylosis without myelopathy or radiculopathy, lumbosacral region: Secondary | ICD-10-CM | POA: Diagnosis not present

## 2012-01-12 DIAGNOSIS — M79609 Pain in unspecified limb: Secondary | ICD-10-CM | POA: Diagnosis not present

## 2012-01-12 DIAGNOSIS — IMO0002 Reserved for concepts with insufficient information to code with codable children: Secondary | ICD-10-CM | POA: Diagnosis not present

## 2012-01-20 ENCOUNTER — Telehealth (HOSPITAL_COMMUNITY): Payer: Self-pay

## 2012-01-20 NOTE — Telephone Encounter (Signed)
The pt called the office wanting to know if it is ok to have a stress test,  Per Dr. Corliss Skains it is ok to have a stress test

## 2012-01-30 DIAGNOSIS — R079 Chest pain, unspecified: Secondary | ICD-10-CM | POA: Diagnosis not present

## 2012-01-30 DIAGNOSIS — R0989 Other specified symptoms and signs involving the circulatory and respiratory systems: Secondary | ICD-10-CM | POA: Diagnosis not present

## 2012-02-08 DIAGNOSIS — R079 Chest pain, unspecified: Secondary | ICD-10-CM | POA: Diagnosis not present

## 2012-02-08 DIAGNOSIS — R0989 Other specified symptoms and signs involving the circulatory and respiratory systems: Secondary | ICD-10-CM | POA: Diagnosis not present

## 2012-02-08 DIAGNOSIS — Z87891 Personal history of nicotine dependence: Secondary | ICD-10-CM | POA: Diagnosis not present

## 2012-02-08 DIAGNOSIS — G8929 Other chronic pain: Secondary | ICD-10-CM | POA: Diagnosis not present

## 2012-02-08 DIAGNOSIS — J449 Chronic obstructive pulmonary disease, unspecified: Secondary | ICD-10-CM | POA: Diagnosis not present

## 2012-02-09 DIAGNOSIS — IMO0002 Reserved for concepts with insufficient information to code with codable children: Secondary | ICD-10-CM | POA: Diagnosis not present

## 2012-02-09 DIAGNOSIS — M47817 Spondylosis without myelopathy or radiculopathy, lumbosacral region: Secondary | ICD-10-CM | POA: Diagnosis not present

## 2012-02-23 DIAGNOSIS — Z79899 Other long term (current) drug therapy: Secondary | ICD-10-CM | POA: Diagnosis not present

## 2012-02-23 DIAGNOSIS — M25559 Pain in unspecified hip: Secondary | ICD-10-CM | POA: Diagnosis not present

## 2012-02-23 DIAGNOSIS — J449 Chronic obstructive pulmonary disease, unspecified: Secondary | ICD-10-CM | POA: Diagnosis not present

## 2012-02-23 DIAGNOSIS — G8929 Other chronic pain: Secondary | ICD-10-CM | POA: Diagnosis not present

## 2012-03-07 ENCOUNTER — Other Ambulatory Visit: Payer: Self-pay | Admitting: Gastroenterology

## 2012-03-07 DIAGNOSIS — R1310 Dysphagia, unspecified: Secondary | ICD-10-CM | POA: Diagnosis not present

## 2012-03-07 DIAGNOSIS — K5289 Other specified noninfective gastroenteritis and colitis: Secondary | ICD-10-CM | POA: Diagnosis not present

## 2012-03-08 DIAGNOSIS — M47817 Spondylosis without myelopathy or radiculopathy, lumbosacral region: Secondary | ICD-10-CM | POA: Diagnosis not present

## 2012-03-08 DIAGNOSIS — IMO0002 Reserved for concepts with insufficient information to code with codable children: Secondary | ICD-10-CM | POA: Diagnosis not present

## 2012-03-08 DIAGNOSIS — M79609 Pain in unspecified limb: Secondary | ICD-10-CM | POA: Diagnosis not present

## 2012-03-14 ENCOUNTER — Ambulatory Visit
Admission: RE | Admit: 2012-03-14 | Discharge: 2012-03-14 | Disposition: A | Discharge status: Home / Self Care | Payer: Medicare Other | Source: Ambulatory Visit | Attending: Gastroenterology | Admitting: Gastroenterology

## 2012-03-14 DIAGNOSIS — K449 Diaphragmatic hernia without obstruction or gangrene: Secondary | ICD-10-CM | POA: Diagnosis not present

## 2012-03-14 DIAGNOSIS — R1310 Dysphagia, unspecified: Secondary | ICD-10-CM

## 2012-03-27 DIAGNOSIS — R1310 Dysphagia, unspecified: Secondary | ICD-10-CM | POA: Diagnosis not present

## 2012-03-27 DIAGNOSIS — K222 Esophageal obstruction: Secondary | ICD-10-CM | POA: Diagnosis not present

## 2012-04-12 DIAGNOSIS — M47817 Spondylosis without myelopathy or radiculopathy, lumbosacral region: Secondary | ICD-10-CM | POA: Diagnosis not present

## 2012-04-12 DIAGNOSIS — IMO0002 Reserved for concepts with insufficient information to code with codable children: Secondary | ICD-10-CM | POA: Diagnosis not present

## 2012-04-12 DIAGNOSIS — M79609 Pain in unspecified limb: Secondary | ICD-10-CM | POA: Diagnosis not present

## 2012-04-16 DIAGNOSIS — J449 Chronic obstructive pulmonary disease, unspecified: Secondary | ICD-10-CM | POA: Diagnosis not present

## 2012-04-16 DIAGNOSIS — R229 Localized swelling, mass and lump, unspecified: Secondary | ICD-10-CM | POA: Diagnosis not present

## 2012-04-16 DIAGNOSIS — Z79899 Other long term (current) drug therapy: Secondary | ICD-10-CM | POA: Diagnosis not present

## 2012-04-16 DIAGNOSIS — M25559 Pain in unspecified hip: Secondary | ICD-10-CM | POA: Diagnosis not present

## 2012-05-10 DIAGNOSIS — IMO0002 Reserved for concepts with insufficient information to code with codable children: Secondary | ICD-10-CM | POA: Diagnosis not present

## 2012-05-10 DIAGNOSIS — M79609 Pain in unspecified limb: Secondary | ICD-10-CM | POA: Diagnosis not present

## 2012-05-10 DIAGNOSIS — M47817 Spondylosis without myelopathy or radiculopathy, lumbosacral region: Secondary | ICD-10-CM | POA: Diagnosis not present

## 2012-06-01 ENCOUNTER — Other Ambulatory Visit: Payer: Self-pay | Admitting: Surgery

## 2012-06-01 DIAGNOSIS — H61009 Unspecified perichondritis of external ear, unspecified ear: Secondary | ICD-10-CM | POA: Diagnosis not present

## 2012-06-01 DIAGNOSIS — L821 Other seborrheic keratosis: Secondary | ICD-10-CM | POA: Diagnosis not present

## 2012-06-01 DIAGNOSIS — D485 Neoplasm of uncertain behavior of skin: Secondary | ICD-10-CM | POA: Diagnosis not present

## 2012-06-01 DIAGNOSIS — L820 Inflamed seborrheic keratosis: Secondary | ICD-10-CM | POA: Diagnosis not present

## 2012-06-07 DIAGNOSIS — IMO0002 Reserved for concepts with insufficient information to code with codable children: Secondary | ICD-10-CM | POA: Diagnosis not present

## 2012-07-11 ENCOUNTER — Other Ambulatory Visit: Payer: Self-pay | Admitting: Internal Medicine

## 2012-07-11 DIAGNOSIS — R609 Edema, unspecified: Secondary | ICD-10-CM | POA: Diagnosis not present

## 2012-07-11 DIAGNOSIS — M79606 Pain in leg, unspecified: Secondary | ICD-10-CM

## 2012-07-11 DIAGNOSIS — I10 Essential (primary) hypertension: Secondary | ICD-10-CM | POA: Diagnosis not present

## 2012-07-11 DIAGNOSIS — I7389 Other specified peripheral vascular diseases: Secondary | ICD-10-CM | POA: Diagnosis not present

## 2012-07-11 DIAGNOSIS — R5383 Other fatigue: Secondary | ICD-10-CM | POA: Diagnosis not present

## 2012-07-11 DIAGNOSIS — Z87891 Personal history of nicotine dependence: Secondary | ICD-10-CM | POA: Diagnosis not present

## 2012-07-11 DIAGNOSIS — R5381 Other malaise: Secondary | ICD-10-CM | POA: Diagnosis not present

## 2012-07-13 ENCOUNTER — Ambulatory Visit
Admission: RE | Admit: 2012-07-13 | Discharge: 2012-07-13 | Disposition: A | Discharge status: Home / Self Care | Payer: Medicare Other | Source: Ambulatory Visit | Attending: Internal Medicine | Admitting: Internal Medicine

## 2012-07-13 DIAGNOSIS — M79606 Pain in leg, unspecified: Secondary | ICD-10-CM

## 2012-07-13 DIAGNOSIS — M47817 Spondylosis without myelopathy or radiculopathy, lumbosacral region: Secondary | ICD-10-CM | POA: Diagnosis not present

## 2012-07-13 DIAGNOSIS — M79609 Pain in unspecified limb: Secondary | ICD-10-CM | POA: Diagnosis not present

## 2012-07-13 DIAGNOSIS — IMO0002 Reserved for concepts with insufficient information to code with codable children: Secondary | ICD-10-CM | POA: Diagnosis not present

## 2012-07-17 ENCOUNTER — Other Ambulatory Visit: Payer: Medicare Other

## 2012-08-01 ENCOUNTER — Other Ambulatory Visit: Payer: Self-pay | Admitting: *Deleted

## 2012-08-09 DIAGNOSIS — M79609 Pain in unspecified limb: Secondary | ICD-10-CM | POA: Diagnosis not present

## 2012-09-03 DIAGNOSIS — E785 Hyperlipidemia, unspecified: Secondary | ICD-10-CM | POA: Diagnosis not present

## 2012-09-03 DIAGNOSIS — J449 Chronic obstructive pulmonary disease, unspecified: Secondary | ICD-10-CM | POA: Diagnosis not present

## 2012-09-03 DIAGNOSIS — F411 Generalized anxiety disorder: Secondary | ICD-10-CM | POA: Diagnosis not present

## 2012-09-03 DIAGNOSIS — G8929 Other chronic pain: Secondary | ICD-10-CM | POA: Diagnosis not present

## 2012-09-03 DIAGNOSIS — Z0000 Encounter for general adult medical examination without abnormal findings: Secondary | ICD-10-CM | POA: Diagnosis not present

## 2012-09-03 DIAGNOSIS — R609 Edema, unspecified: Secondary | ICD-10-CM | POA: Diagnosis not present

## 2012-09-06 DIAGNOSIS — IMO0002 Reserved for concepts with insufficient information to code with codable children: Secondary | ICD-10-CM | POA: Diagnosis not present

## 2012-09-06 DIAGNOSIS — M79609 Pain in unspecified limb: Secondary | ICD-10-CM | POA: Diagnosis not present

## 2012-09-06 DIAGNOSIS — M47817 Spondylosis without myelopathy or radiculopathy, lumbosacral region: Secondary | ICD-10-CM | POA: Diagnosis not present

## 2012-10-02 ENCOUNTER — Encounter: Payer: Medicare Other | Admitting: Vascular Surgery

## 2012-10-04 DIAGNOSIS — M47812 Spondylosis without myelopathy or radiculopathy, cervical region: Secondary | ICD-10-CM | POA: Diagnosis not present

## 2012-10-04 DIAGNOSIS — M47817 Spondylosis without myelopathy or radiculopathy, lumbosacral region: Secondary | ICD-10-CM | POA: Diagnosis not present

## 2012-10-04 DIAGNOSIS — IMO0002 Reserved for concepts with insufficient information to code with codable children: Secondary | ICD-10-CM | POA: Diagnosis not present

## 2012-11-01 DIAGNOSIS — M542 Cervicalgia: Secondary | ICD-10-CM | POA: Diagnosis not present

## 2012-11-01 DIAGNOSIS — IMO0002 Reserved for concepts with insufficient information to code with codable children: Secondary | ICD-10-CM | POA: Diagnosis not present

## 2012-11-01 DIAGNOSIS — M47817 Spondylosis without myelopathy or radiculopathy, lumbosacral region: Secondary | ICD-10-CM | POA: Diagnosis not present

## 2012-11-01 DIAGNOSIS — G5700 Lesion of sciatic nerve, unspecified lower limb: Secondary | ICD-10-CM | POA: Diagnosis not present

## 2012-12-03 DIAGNOSIS — H61009 Unspecified perichondritis of external ear, unspecified ear: Secondary | ICD-10-CM | POA: Diagnosis not present

## 2012-12-03 DIAGNOSIS — Z79899 Other long term (current) drug therapy: Secondary | ICD-10-CM | POA: Diagnosis not present

## 2012-12-03 DIAGNOSIS — K219 Gastro-esophageal reflux disease without esophagitis: Secondary | ICD-10-CM | POA: Diagnosis not present

## 2012-12-06 ENCOUNTER — Other Ambulatory Visit (HOSPITAL_COMMUNITY): Payer: Self-pay | Admitting: Interventional Radiology

## 2012-12-06 DIAGNOSIS — M47817 Spondylosis without myelopathy or radiculopathy, lumbosacral region: Secondary | ICD-10-CM | POA: Diagnosis not present

## 2012-12-06 DIAGNOSIS — I729 Aneurysm of unspecified site: Secondary | ICD-10-CM

## 2012-12-06 DIAGNOSIS — M542 Cervicalgia: Secondary | ICD-10-CM | POA: Diagnosis not present

## 2012-12-06 DIAGNOSIS — R42 Dizziness and giddiness: Secondary | ICD-10-CM

## 2012-12-06 DIAGNOSIS — M79609 Pain in unspecified limb: Secondary | ICD-10-CM | POA: Diagnosis not present

## 2012-12-06 DIAGNOSIS — IMO0002 Reserved for concepts with insufficient information to code with codable children: Secondary | ICD-10-CM | POA: Diagnosis not present

## 2012-12-11 ENCOUNTER — Telehealth (HOSPITAL_COMMUNITY): Payer: Self-pay | Admitting: Interventional Radiology

## 2012-12-11 NOTE — Telephone Encounter (Signed)
Called pt to schedule MRI/MRA - pt states she is still in bed and will call me when she gets up to sched. appt JMichaux

## 2012-12-17 ENCOUNTER — Other Ambulatory Visit (HOSPITAL_COMMUNITY): Payer: Self-pay | Admitting: Interventional Radiology

## 2012-12-17 DIAGNOSIS — I729 Aneurysm of unspecified site: Secondary | ICD-10-CM

## 2012-12-17 DIAGNOSIS — R42 Dizziness and giddiness: Secondary | ICD-10-CM

## 2012-12-25 ENCOUNTER — Ambulatory Visit (HOSPITAL_COMMUNITY): Admission: RE | Admit: 2012-12-25 | Payer: Medicare Other | Source: Ambulatory Visit

## 2013-01-01 ENCOUNTER — Ambulatory Visit (HOSPITAL_COMMUNITY): Admission: RE | Admit: 2013-01-01 | Payer: Medicare Other | Source: Ambulatory Visit

## 2013-01-01 ENCOUNTER — Ambulatory Visit (HOSPITAL_COMMUNITY): Payer: Medicare Other

## 2013-01-10 DIAGNOSIS — M47817 Spondylosis without myelopathy or radiculopathy, lumbosacral region: Secondary | ICD-10-CM | POA: Diagnosis not present

## 2013-01-10 DIAGNOSIS — M79609 Pain in unspecified limb: Secondary | ICD-10-CM | POA: Diagnosis not present

## 2013-01-10 DIAGNOSIS — IMO0002 Reserved for concepts with insufficient information to code with codable children: Secondary | ICD-10-CM | POA: Diagnosis not present

## 2013-01-14 DIAGNOSIS — R1310 Dysphagia, unspecified: Secondary | ICD-10-CM | POA: Diagnosis not present

## 2013-01-16 ENCOUNTER — Encounter (HOSPITAL_COMMUNITY): Admission: RE | Admit: 2013-01-16 | Payer: Medicare Other | Source: Ambulatory Visit

## 2013-01-16 ENCOUNTER — Encounter (HOSPITAL_COMMUNITY)
Admission: RE | Admit: 2013-01-16 | Discharge: 2013-01-16 | Disposition: A | Discharge status: Home / Self Care | Payer: Medicare Other | Source: Ambulatory Visit | Attending: Interventional Radiology | Admitting: Interventional Radiology

## 2013-01-16 DIAGNOSIS — R42 Dizziness and giddiness: Secondary | ICD-10-CM | POA: Diagnosis not present

## 2013-01-16 DIAGNOSIS — R51 Headache: Secondary | ICD-10-CM | POA: Insufficient documentation

## 2013-01-16 DIAGNOSIS — I671 Cerebral aneurysm, nonruptured: Secondary | ICD-10-CM | POA: Diagnosis not present

## 2013-01-16 DIAGNOSIS — I729 Aneurysm of unspecified site: Secondary | ICD-10-CM

## 2013-01-16 LAB — CREATININE, SERUM
GFR calc Af Amer: 84 mL/min — ABNORMAL LOW (ref >90)
GFR calc non Af Amer: 72 mL/min — ABNORMAL LOW (ref >90)

## 2013-01-16 MED ORDER — GADOBENATE DIMEGLUMINE 529 MG/ML IV SOLN
10.0000 mL | Freq: Once | INTRAVENOUS | Status: AC | PRN
  Administered 2013-01-16: 10 mL via INTRAVENOUS

## 2013-01-22 ENCOUNTER — Telehealth (HOSPITAL_COMMUNITY): Payer: Self-pay | Admitting: Interventional Radiology

## 2013-01-22 NOTE — Telephone Encounter (Signed)
Pt called to say that she was given contrast dye in her recent MRI scan and has had a reaction to this. She states that she has been dizzy and feeling "drunk" since having the dye. She also requests that this be added to her list of allergies. I will give the information to Beckey Downing, PA to put in [facility] as I do not have security clearance to do so. JMichaux

## 2013-01-28 ENCOUNTER — Encounter (HOSPITAL_COMMUNITY): Admission: RE | Payer: Self-pay | Source: Ambulatory Visit

## 2013-01-28 ENCOUNTER — Ambulatory Visit (HOSPITAL_COMMUNITY): Admit: 2013-01-28 | Payer: Self-pay | Admitting: Gastroenterology

## 2013-01-28 ENCOUNTER — Ambulatory Visit (HOSPITAL_COMMUNITY): Admission: RE | Admit: 2013-01-28 | Payer: Medicare Other | Source: Ambulatory Visit | Admitting: Gastroenterology

## 2013-01-28 ENCOUNTER — Encounter (HOSPITAL_COMMUNITY): Payer: Self-pay

## 2013-01-28 SURGERY — MANOMETRY, ESOPHAGUS
Anesthesia: Topical

## 2013-02-07 DIAGNOSIS — IMO0002 Reserved for concepts with insufficient information to code with codable children: Secondary | ICD-10-CM | POA: Diagnosis not present

## 2013-02-07 DIAGNOSIS — M79609 Pain in unspecified limb: Secondary | ICD-10-CM | POA: Diagnosis not present

## 2013-02-07 DIAGNOSIS — M47817 Spondylosis without myelopathy or radiculopathy, lumbosacral region: Secondary | ICD-10-CM | POA: Diagnosis not present

## 2013-02-08 ENCOUNTER — Emergency Department (HOSPITAL_COMMUNITY): Payer: Medicare Other

## 2013-02-08 ENCOUNTER — Emergency Department (HOSPITAL_COMMUNITY)
Admission: EM | Admit: 2013-02-08 | Discharge: 2013-02-08 | Discharge status: Against Medical Advice | Payer: Medicare Other | Attending: Emergency Medicine | Admitting: Emergency Medicine

## 2013-02-08 ENCOUNTER — Encounter (HOSPITAL_COMMUNITY): Payer: Self-pay | Admitting: Emergency Medicine

## 2013-02-08 DIAGNOSIS — F17200 Nicotine dependence, unspecified, uncomplicated: Secondary | ICD-10-CM | POA: Diagnosis not present

## 2013-02-08 DIAGNOSIS — M129 Arthropathy, unspecified: Secondary | ICD-10-CM | POA: Diagnosis not present

## 2013-02-08 DIAGNOSIS — M62838 Other muscle spasm: Secondary | ICD-10-CM | POA: Diagnosis not present

## 2013-02-08 DIAGNOSIS — Z7982 Long term (current) use of aspirin: Secondary | ICD-10-CM | POA: Diagnosis not present

## 2013-02-08 DIAGNOSIS — G8929 Other chronic pain: Secondary | ICD-10-CM | POA: Insufficient documentation

## 2013-02-08 DIAGNOSIS — J449 Chronic obstructive pulmonary disease, unspecified: Secondary | ICD-10-CM | POA: Insufficient documentation

## 2013-02-08 DIAGNOSIS — M25559 Pain in unspecified hip: Secondary | ICD-10-CM | POA: Diagnosis not present

## 2013-02-08 DIAGNOSIS — Z79899 Other long term (current) drug therapy: Secondary | ICD-10-CM | POA: Insufficient documentation

## 2013-02-08 DIAGNOSIS — Z880 Allergy status to penicillin: Secondary | ICD-10-CM | POA: Insufficient documentation

## 2013-02-08 DIAGNOSIS — M25551 Pain in right hip: Secondary | ICD-10-CM

## 2013-02-08 DIAGNOSIS — R6889 Other general symptoms and signs: Secondary | ICD-10-CM | POA: Diagnosis not present

## 2013-02-08 DIAGNOSIS — S79919A Unspecified injury of unspecified hip, initial encounter: Secondary | ICD-10-CM | POA: Diagnosis not present

## 2013-02-08 DIAGNOSIS — J4489 Other specified chronic obstructive pulmonary disease: Secondary | ICD-10-CM | POA: Insufficient documentation

## 2013-02-08 HISTORY — DX: Age-related osteoporosis without current pathological fracture: M81.0

## 2013-02-08 HISTORY — DX: Unspecified osteoarthritis, unspecified site: M19.90

## 2013-02-08 HISTORY — DX: Chronic obstructive pulmonary disease, unspecified: J44.9

## 2013-02-08 MED ORDER — OXYCODONE-ACETAMINOPHEN 5-325 MG PO TABS
1.0000 | ORAL_TABLET | Freq: Once | ORAL | Status: AC
  Administered 2013-02-08: 1 via ORAL
  Filled 2013-02-08: qty 1

## 2013-02-08 NOTE — ED Notes (Signed)
Per EMS pt had a fall about 1 month ago, didn't have any pain initially after fall but around 2wks later pt began having pain to rt hip. No visible deformity or external rotation. Pt has been ambulatory. Pt wears a fentanyl patch for pain. Pt was not seen after the fall for the hip pain. 160/90, 86 HR.

## 2013-02-08 NOTE — ED Notes (Signed)
Pt states she fell around the 13th of August and injured her rt hip, she tripped over the edge of a rug. Pt states she has had injury to left hip before and chronic back pain and goes to pain management for chronic pain sx. Pt rates pain 10/10 and is tearful. Pt states she saw her pain management doctor yesterday and was told she needed to come to ED for MRI of hip. No visible bruising or deformity noted to rt hip.

## 2013-02-08 NOTE — ED Notes (Addendum)
Pt was given the option of tylenol until the MD could see her and pt stated that the tylenol would not do anything for her. Pt was informed that she must wait until the MD prescribes her something else for her pain. Pt tearful. Pt is currently wearing her fentanyl patch.

## 2013-02-08 NOTE — ED Provider Notes (Signed)
CSN: [id number]     Arrival date & time 02/08/13  1820 History   First MD Initiated Contact with Patient 02/08/13 1916     Chief Complaint  Patient presents with  . Hip Pain    rt   (Consider location/radiation/quality/duration/timing/severity/associated sxs/prior Treatment) The history is provided by the patient. No language interpreter was used.  [name] [name] is 67 year old female with past medical history of COPD, arthritis, osteoporosis, fibromyalgia, chronic back and hip pain since 1999 presenting to emergency department with worsening right hip pain that started last week. Patient reported that approximately one month ago she fell in her home. Patient reported that she fell asleep sitting up one night when she got up to go to bed she tripped over carpet and landed on her right hip. Patient reported that she was properly controlled on her fentanyl patch from her pain management specialist, that started to have right hip discomfort starting last week. Patient reported the right hip is under constant pain, rated the pain 10 minutes and, reported that even hurts to touch the right hip. Patient reports that the pain is radiating down the leg, reported it feels like it is going in the muscle. Patient reported that after the event she was walking around and ambulatory. Reported that pain is worse with motion, standing, driving-reported that pain improves with pain medications. Patient reported that she was seen by her pain management specialist yesterday who recommended he come to [facility] apartment to get imaging performed, patient reported that she has to get an MRI performed regarding her injury. Denied nausea, vomiting, diarrhea, numbness, tingling, weakness, chest pain, shortness of breath, difficulty breathing, head injury, blood thinners, headache, dizziness, sudden loss of vision, blurred vision. PCP Dr. Renae Gloss Pain management specialist Ewing Schlein  Past Medical History  Diagnosis Date  .  Arthritis   . Osteoporosis   . COPD (chronic obstructive pulmonary disease)    History reviewed. No pertinent past surgical history. No family history on file. History  Substance Use Topics  . Smoking status: Current Every Day Smoker -- 2.00 packs/day  . Smokeless tobacco: Never Used  . Alcohol Use: No   OB History   Grav Para Term Preterm Abortions TAB SAB Ect Mult Living                 Review of Systems  Constitutional: Negative for fever and chills.  HENT: Positive for neck pain (baseline for patient). Negative for sore throat and trouble swallowing.   Eyes: Negative for visual disturbance.  Respiratory: Negative for chest tightness.   Gastrointestinal: Negative for nausea and vomiting.  Musculoskeletal: Positive for arthralgias (right hip pain).  Neurological: Negative for dizziness, weakness and numbness.  All other systems reviewed and are negative.    Allergies  Aspirin; Ciprofloxacin; Contrast media; Erythromycin base; Gadolinium derivatives; Ibuprofen; and Penicillins  Home Medications   Current Outpatient Rx  Name  Route  Sig  Dispense  Refill  . albuterol (PROVENTIL) (2.5 MG/3ML) 0.083% nebulizer solution   Nebulization   Take 2.5 mg by nebulization every 6 (six) hours as needed.         Marland Kitchen alprazolam (XANAX) 2 MG tablet   Oral   Take 6 mg by mouth at bedtime as needed for sleep.          Marland Kitchen aspirin EC 81 MG tablet   Oral   Take 81 mg by mouth daily.         . budesonide-formoterol (SYMBICORT) 80-4.5 MCG/ACT inhaler  Inhalation   Inhale 2 puffs into the lungs 2 (two) times daily.         . Cholecalciferol (VITAMIN D-3 PO)   Oral   Take 1 tablet by mouth daily.         . diphenoxylate-atropine (LOMOTIL) 2.5-0.025 MG per tablet   Oral   Take 1 tablet by mouth 4 (four) times daily as needed.         . docusate sodium (COLACE) 100 MG capsule   Oral   Take 100 mg by mouth daily as needed for constipation.         Marland Kitchen esomeprazole  (NEXIUM) 40 MG capsule   Oral   Take 40 mg by mouth 2 (two) times daily.          . fentaNYL (DURAGESIC - DOSED MCG/HR) 50 MCG/HR   Transdermal   Place 1 patch onto the skin every other day.         . gabapentin (NEURONTIN) 600 MG tablet   Oral   Take 1,800-2,400 mg by mouth 3 (three) times daily.          Marland Kitchen oxyCODONE (OXYCONTIN) 10 MG 12 hr tablet   Oral   Take 40-60 mg by mouth every 12 (twelve) hours.         Marland Kitchen tiZANidine (ZANAFLEX) 4 MG tablet   Oral   Take 8-12 mg by mouth every 4 (four) hours as needed (muscle pain).          . traZODone (DESYREL) 50 MG tablet   Oral   Take 200 mg by mouth at bedtime.          Marland Kitchen acyclovir ointment (ZOVIRAX) 5 %   Topical   Apply 1 application topically every 3 (three) hours as needed (rash).           BP 180/76  Pulse 77  Temp(Src) 98.9 F (37.2 C) (Oral)  Resp 20  SpO2 98% Physical Exam  Nursing note and vitals reviewed. Constitutional: She is oriented to person, place, and time. She appears well-developed and well-nourished. No distress.  Patient tearful upon exam, reported that she is constantly in pain  HENT:  Head: Normocephalic and atraumatic.  Mouth/Throat: Oropharynx is clear and moist. No oropharyngeal exudate.  Eyes: Conjunctivae and EOM are normal. Pupils are equal, round, and reactive to light. Right eye exhibits no discharge. Left eye exhibits no discharge.  Neck: Normal range of motion. Neck supple.  Cardiovascular: Normal rate, regular rhythm and normal heart sounds.  Exam reveals no friction rub.   No murmur heard. Pulses:      Radial pulses are 2+ on the right side, and 2+ on the left side.       Dorsalis pedis pulses are 2+ on the right side, and 2+ on the left side.  Pulmonary/Chest: Effort normal and breath sounds normal. No respiratory distress. She has no wheezes. She has no rales.  Musculoskeletal: She exhibits tenderness.       Right hip: She exhibits decreased range of motion (Secondary  to pain), decreased strength (secondary to pain) and tenderness. She exhibits no swelling, no crepitus, no deformity and no laceration.       Right knee: She exhibits decreased range of motion (Secondary to pain). Tenderness (circumferential) found.       Right ankle: Tenderness.       Arms:      Legs: Patient able to perform abduction and adduction of the right hip. Decreased flexion and extension  of the right knee secondary to pain. Decreased inversion and eversion of the right ankle secondary to pain. Patient able to wiggle toes, flex and extend. Pain upon lite palpation from the right hip down to the plantar aspect of the right foot.   Pain upon lite palpation to the right shoulder, glenohumeral region. Decreased range of motion, specifically flexion secondary to pain.  Lymphadenopathy:    She has no cervical adenopathy.  Neurological: She is alert and oriented to person, place, and time. No cranial nerve deficit. She exhibits normal muscle tone. Coordination normal.  Cranial nerves II through XII grossly intact Sensation intact upper and lower extremities bilaterally with differentiation to sharp and dull touch Strength 4+/5+ with resistance to lower extremities secondary to pain Patient ambulatory and able to walk without difficulty - steady gait without sway.   Skin: Skin is warm and dry. No rash noted. She is not diaphoretic. No erythema.  Psychiatric: Her behavior is normal.  Patient tearful upon the entire exam    ED Course  Procedures (including critical care time)  Medications  oxyCODONE-acetaminophen (PERCOCET/ROXICET) 5-325 MG per tablet 1 tablet (1 tablet Oral Given 02/08/13 2127)   7:50 PM Prior to this provider seeing the patient, patient was complaining about pain and was wanting pain medications. When offered Tylenol patient refused because it was not strong enough.  8:22 PM Spoke with radiology - right shoulder x-ray ordered since patient was complaining about right  shoulder discomfort and landing on her right side approximately one month ago. Radiology called this provider and stated that patient is refusing to get right shoulder x-ray, as per radiologist patient reported that her right shoulder pain specifically do to her fibromyalgia. Patient refusing right shoulder image.  9:30 PM Percocet order placed. As per nurse report, when nurse the patient pain medication patient asked specifically what doses pain medication was. Nurse stated that when she told the patient that dose of pain medication patient became angry and reported that this would not help her control her pain. Nurse reported that patient became angry and start to get up and get dressed. Patient became angry due to not getting proper pain control. Patient also mad that MRI was not performed on her right hip.  9:34 PM Patient threatening to leave. Patient complaining that she did not get proper pain management. Patient currently on fentanyl patch, patient accepted Percocet dose. Dr. Dub Mikes tried to talk with patient, patient refused to talk and stay. Patient left the hospital.  Labs Review Labs Reviewed - No data to display Imaging Review Dg Hip Complete Right  02/08/2013   *RADIOLOGY REPORT*  Clinical Data: Right hip pain post fall  RIGHT HIP - COMPLETE 2+ VIEW  Comparison: None  Findings: Diffuse osseous demineralization. Hip and SI joints symmetric. No acute fracture, dislocation, or bone destruction.  IMPRESSION: No acute osseous abnormalities.   Original Report Authenticated By: Ulyses Southward, M.D.    MDM   1. Chronic left hip pain   2. Right hip pain     Patient presenting to emergency Department chronic bilateral hip pain, reported that right hip pain has worsened over the past week secondary to fall that occurred approximately one month ago. Patient reports that the pain is worsened with radiation down the right leg. Patient has history of heart myalgia, chronic pain. Patient was seen  by pain management specialist, was referred to come to emergency department to get imaging performed by pain management specialist that she saw yesterday the [facility]  and oriented. Full abduction and adduction - decreased flexion and extension of the right hip secondary to pain. Decreased extension and flexion of the right knee secondary to pain pain upon light palpation to the entire right leg going from right hip all the way down to the bottom of the foot. Patient ambulated well and able to walk without difficulty.  X-ray of right hip negative acute abnormalities identified-no acute fracture, dislocation bone destruction. Diffuse demineralization identified. Patient was unhappy regarding Percocet 5-325 mg given instead of 10-325 given. Patient reported that she is on fentanyl patches on a daily basis and sees a chronic pain specialist, reported that these medications administered in the emergency department could not control her pain. Patient agitated. Patient refused right shoulder imaging, reported that her right shoulder pain is secondary to her fibromyalgia-reported that she does not need a right shoulder image. Patient threatening to leave. Patient reported that she is very unhappy that her pain was not controlled properly and how she was treated. Dr. Dub Mikes present at event, tried to talk with patient and have her stay, patient refused and did not want to listen. Patient dressed herself and walked out of the emergency department.   Raymon Mutton, PA-C 02/09/13 0136

## 2013-02-08 NOTE — ED Provider Notes (Signed)
9:25 PM Patient with chronic right hip pain since fall possibly one month ago. Patient and her legs without difficulty. Patient is angry that she has not gotten adequate analgesia since here. I offered further evaluation which she declined  Doug Sou, MD 02/08/13 2132

## 2013-02-08 NOTE — ED Notes (Signed)
Pt upset and states she wants to go home. Pt is ambulatory in room and has gotten dressed. Pt states she has called someone to come pick her up.

## 2013-02-08 NOTE — ED Notes (Signed)
Dr. Ethelda Chick attempted to talk to pt, pt refused any further treatment. Pt ambulated out of ED without assistance.

## 2013-02-09 ENCOUNTER — Telehealth (HOSPITAL_COMMUNITY): Payer: Self-pay | Admitting: Emergency Medicine

## 2013-02-10 NOTE — ED Provider Notes (Signed)
Medical screening examination/treatment/procedure(s) were conducted as a shared visit with non-physician practitioner(s) and myself.  I personally evaluated the patient during the encounter  Rodrigues Urbanek, MD 02/10/13 0031 

## 2013-02-11 DIAGNOSIS — R358 Other polyuria: Secondary | ICD-10-CM | POA: Diagnosis not present

## 2013-02-11 DIAGNOSIS — N952 Postmenopausal atrophic vaginitis: Secondary | ICD-10-CM | POA: Diagnosis not present

## 2013-03-05 DIAGNOSIS — M25559 Pain in unspecified hip: Secondary | ICD-10-CM | POA: Diagnosis not present

## 2013-03-07 DIAGNOSIS — M169 Osteoarthritis of hip, unspecified: Secondary | ICD-10-CM | POA: Diagnosis not present

## 2013-03-07 DIAGNOSIS — IMO0002 Reserved for concepts with insufficient information to code with codable children: Secondary | ICD-10-CM | POA: Diagnosis not present

## 2013-03-07 DIAGNOSIS — M47817 Spondylosis without myelopathy or radiculopathy, lumbosacral region: Secondary | ICD-10-CM | POA: Diagnosis not present

## 2013-04-11 DIAGNOSIS — M169 Osteoarthritis of hip, unspecified: Secondary | ICD-10-CM | POA: Diagnosis not present

## 2013-04-11 DIAGNOSIS — IMO0002 Reserved for concepts with insufficient information to code with codable children: Secondary | ICD-10-CM | POA: Diagnosis not present

## 2013-04-11 DIAGNOSIS — M47817 Spondylosis without myelopathy or radiculopathy, lumbosacral region: Secondary | ICD-10-CM | POA: Diagnosis not present

## 2013-04-17 IMAGING — RF DG ESOPHAGUS
15 of 18 series · 19 of 24 positions shown · non-contrast
Comparison: None

CLINICAL DATA: Dysphagia.

ESOPHOGRAM/BARIUM SWALLOW
TECHNIQUE: Single contrast examination was performed using thin
barium.
Fluoroscopy time:  1.9 minutes.

[Series 1: run · 4 of 12 slices shown (1 of 15)]
[im 1/12]
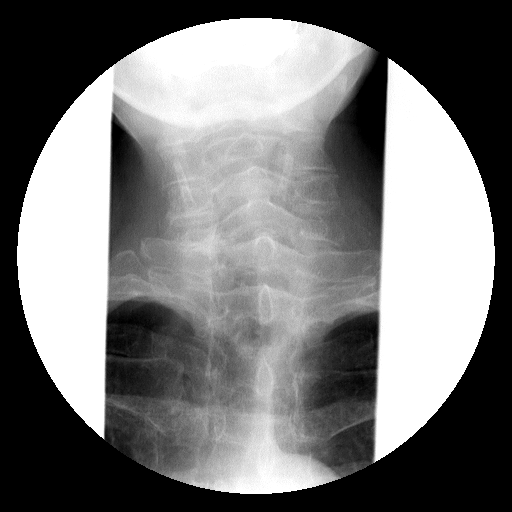
[im 3/12]
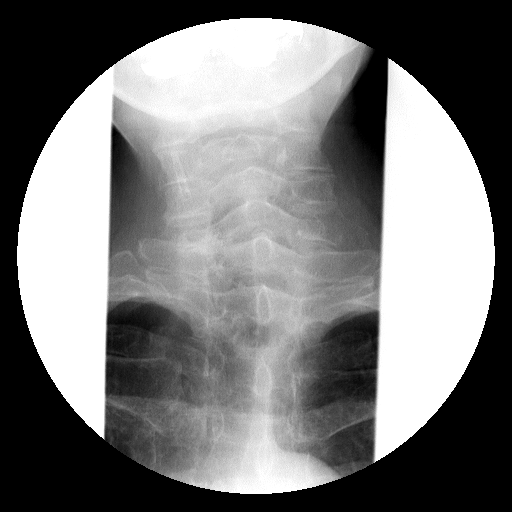
[im 9/12]
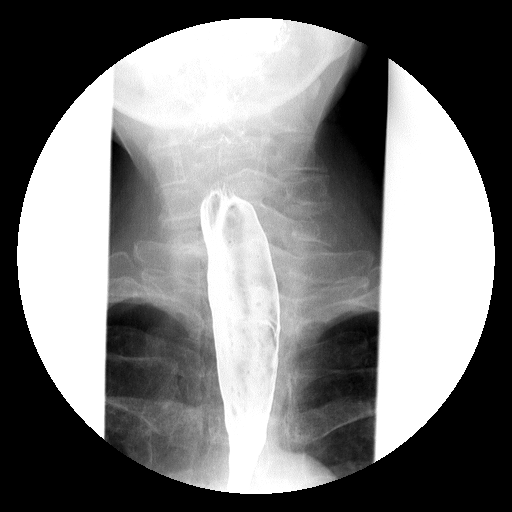
[im 12/12]
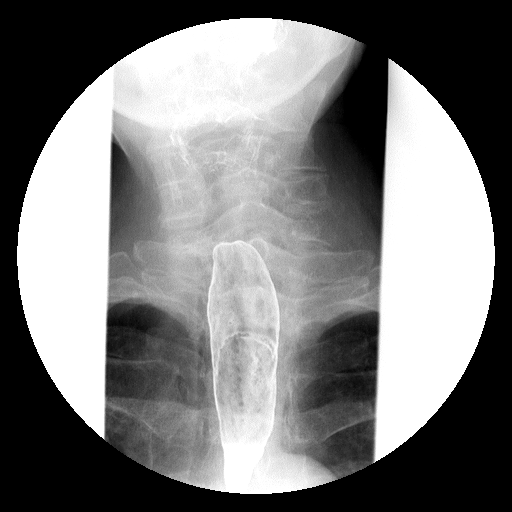

[Series 2: run · 2 of 9 slices shown (2 of 15)]
[im 1/9]
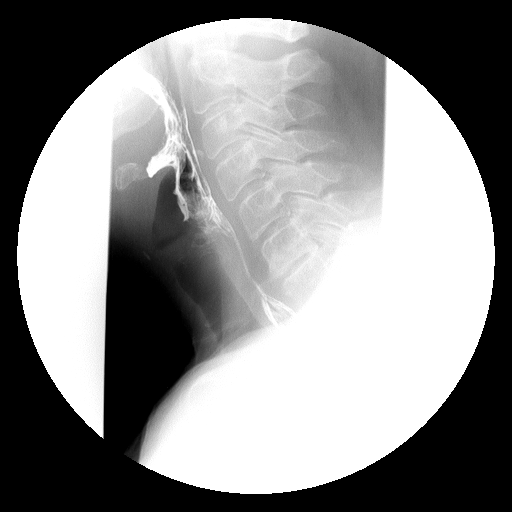
[im 5/9]
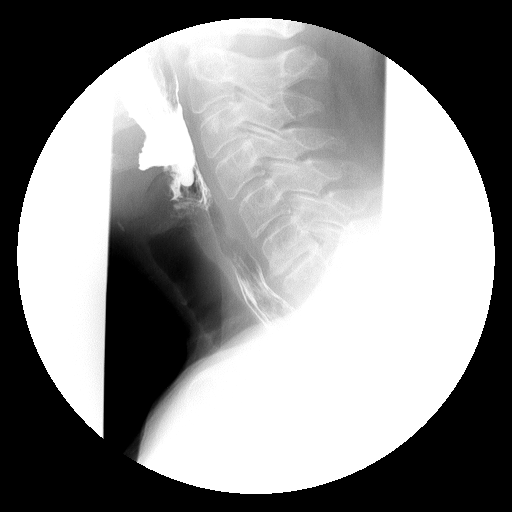

[Series 3: run · 1 of 1 slices shown (3 of 15)]
[im 1/1]
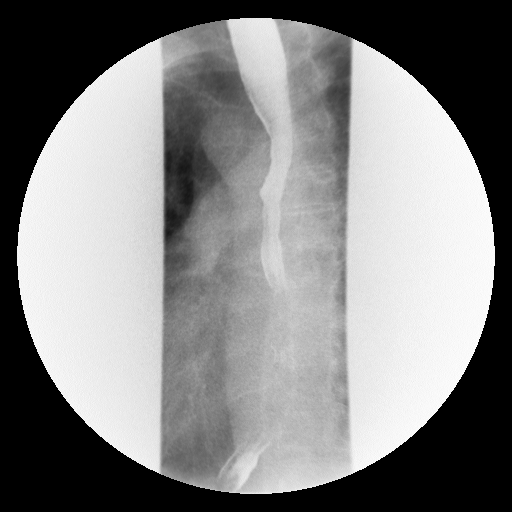

[Series 4: run · 1 of 1 slices shown (4 of 15)]
[im 1/1]
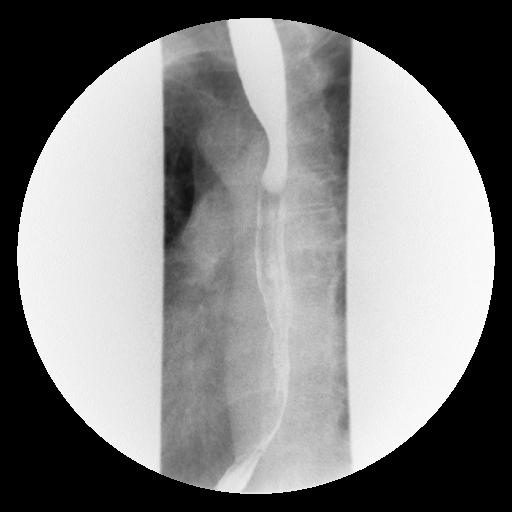

[Series 5: run · 1 of 1 slices shown (5 of 15)]
[im 1/1]
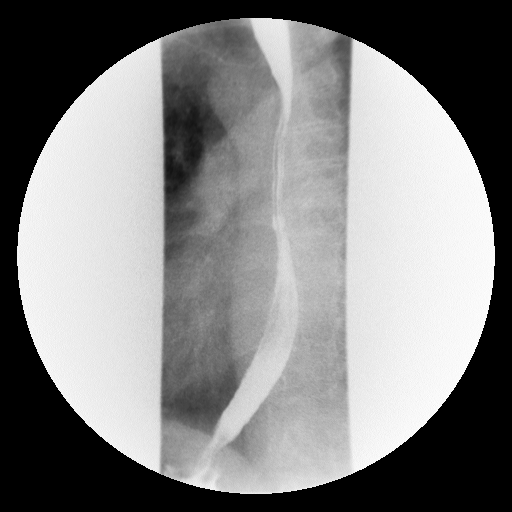

[Series 7: run · 1 of 1 slices shown (6 of 15)]
[im 1/1]
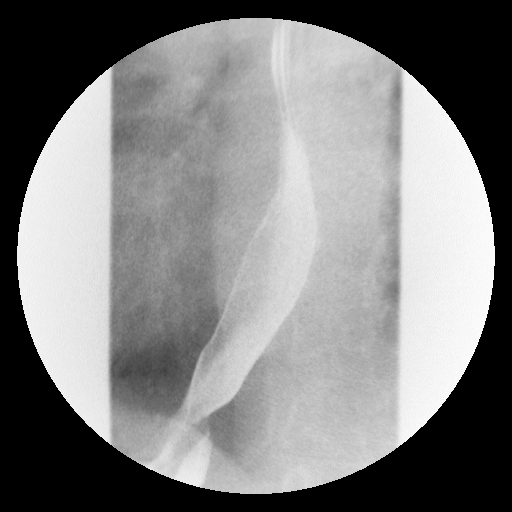

[Series 8: run · 1 of 1 slices shown (7 of 15)]
[im 1/1]
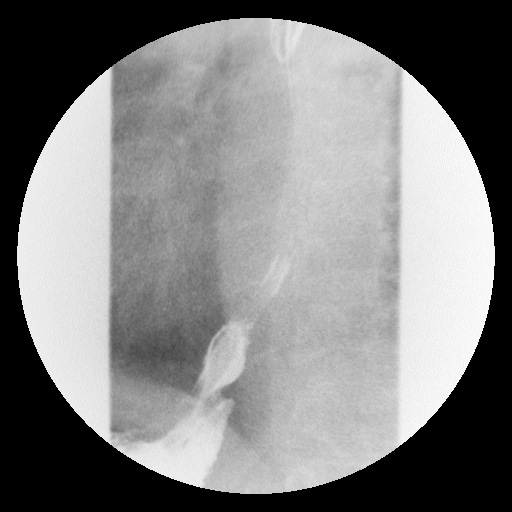

[Series 9: run · 1 of 1 slices shown (8 of 15)]
[im 1/1]
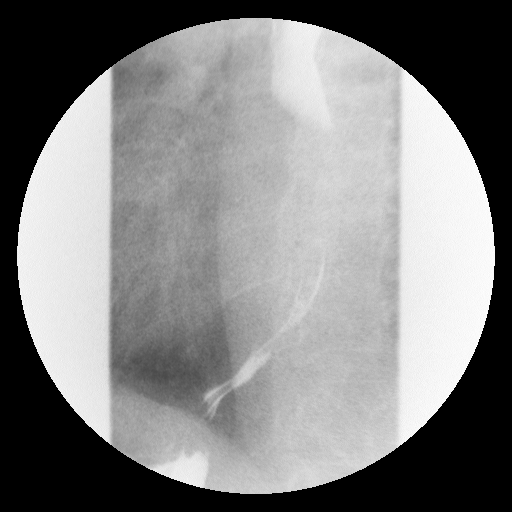

[Series 10: run · 1 of 1 slices shown (9 of 15)]
[im 1/1]
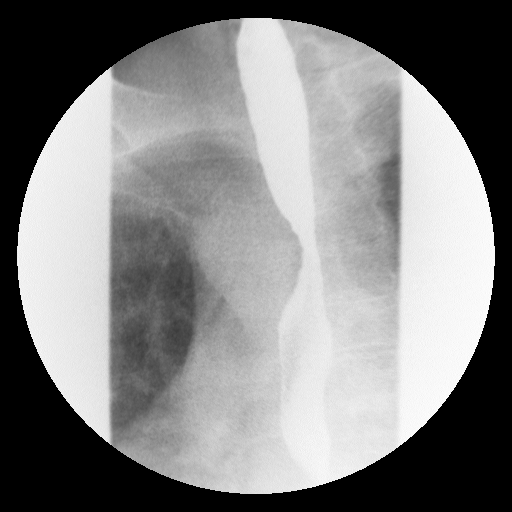

[Series 12: run · 1 of 1 slices shown (10 of 15)]
[im 1/1]
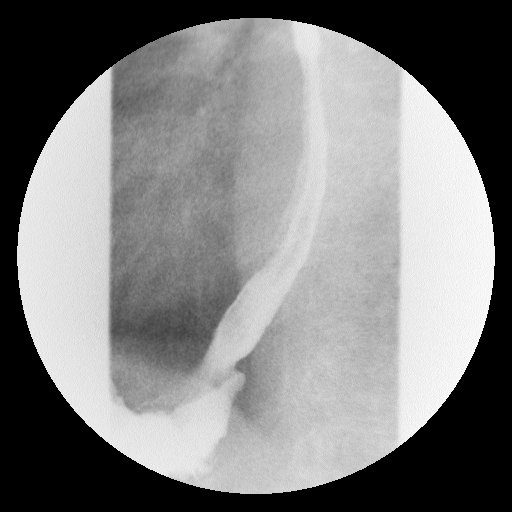

[Series 13: run · 1 of 1 slices shown (11 of 15)]
[im 1/1]
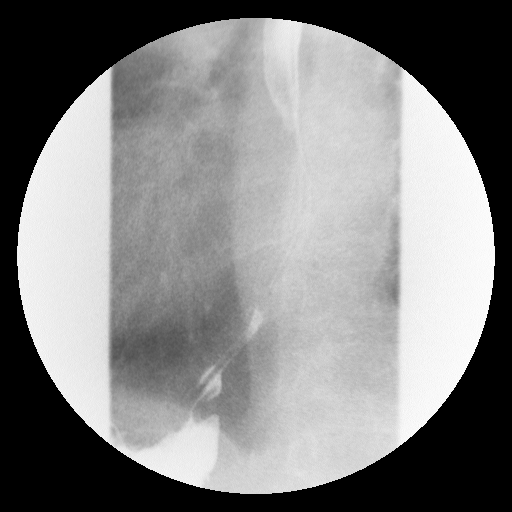

[Series 14: run · 1 of 1 slices shown (12 of 15)]
[im 1/1]
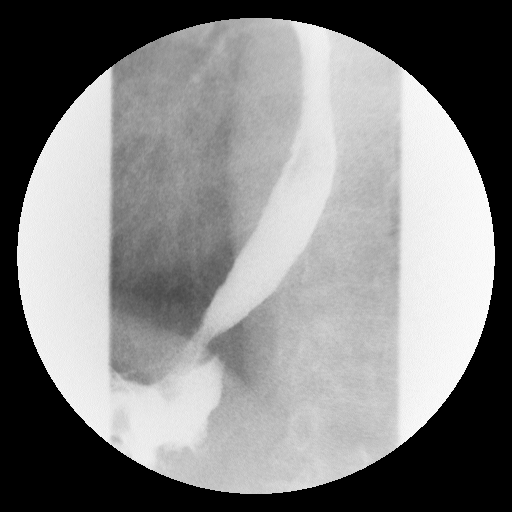

[Series 15: run · 1 of 1 slices shown (13 of 15)]
[im 1/1]
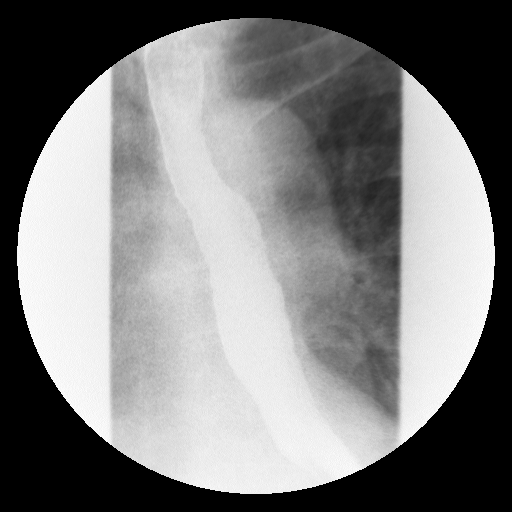

[Series 17: run · 1 of 1 slices shown (14 of 15)]
[im 1/1]
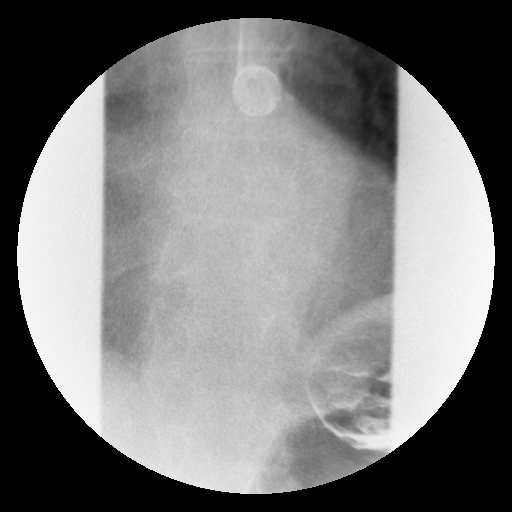

[Series 18: run · 1 of 1 slices shown (15 of 15)]
[im 1/1]
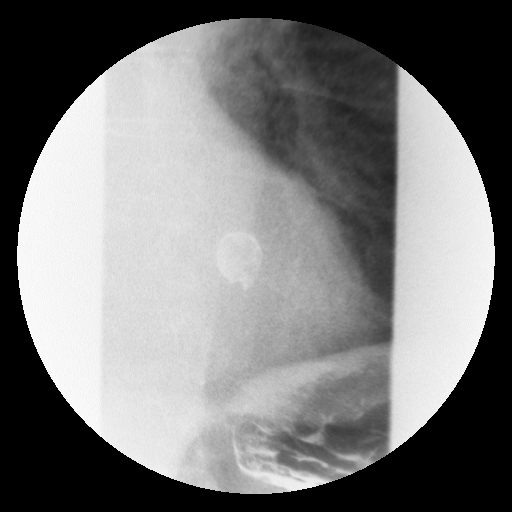

[19 of 24 positions shown; findings below may reference images not displayed]

FINDINGS: Initial barium swallows demonstrate normal pharyngeal
motion with swallowing.  No laryngeal penetration and/or
aspiration.  No upper esophageal webs, strictures diverticuli.  The
piriform sinus and vallecular air spaces appear normal.  Minimal
degenerative spurring noted at C6-7.

Nonspecific esophageal dysmotility with occasional disruption of
the primary peristaltic wave and occasional tertiary contractions
and esophageal stasis.  There is a small sliding-type hiatal
hernia.  No gastroesophageal reflux was demonstrated.  No obvious
strictured narrowing of the distal esophagus but the 13 mm barium
pill would not pass into the stomach and there may be a mild smooth
stricture at the GE junction.
IMPRESSION: 1.  Small hiatal hernia without demonstrable GE reflux.
2.  Mild smooth narrowing near the GE junction could be a reflux
stricture.  The barium pill would not pass through this area.

## 2013-05-13 DIAGNOSIS — M25559 Pain in unspecified hip: Secondary | ICD-10-CM | POA: Diagnosis not present

## 2013-05-13 DIAGNOSIS — M2550 Pain in unspecified joint: Secondary | ICD-10-CM | POA: Diagnosis not present

## 2013-05-13 DIAGNOSIS — R269 Unspecified abnormalities of gait and mobility: Secondary | ICD-10-CM | POA: Diagnosis not present

## 2013-05-16 DIAGNOSIS — M169 Osteoarthritis of hip, unspecified: Secondary | ICD-10-CM | POA: Diagnosis not present

## 2013-05-16 DIAGNOSIS — IMO0002 Reserved for concepts with insufficient information to code with codable children: Secondary | ICD-10-CM | POA: Diagnosis not present

## 2013-05-16 DIAGNOSIS — M47817 Spondylosis without myelopathy or radiculopathy, lumbosacral region: Secondary | ICD-10-CM | POA: Diagnosis not present

## 2013-05-23 ENCOUNTER — Emergency Department (HOSPITAL_COMMUNITY)
Admission: EM | Admit: 2013-05-23 | Discharge: 2013-05-23 | Disposition: A | Discharge status: Home / Self Care | Payer: Medicare Other | Attending: Emergency Medicine | Admitting: Emergency Medicine

## 2013-05-23 ENCOUNTER — Encounter (HOSPITAL_COMMUNITY): Payer: Self-pay | Admitting: Emergency Medicine

## 2013-05-23 ENCOUNTER — Emergency Department (HOSPITAL_COMMUNITY): Payer: Medicare Other

## 2013-05-23 DIAGNOSIS — M79609 Pain in unspecified limb: Secondary | ICD-10-CM | POA: Diagnosis not present

## 2013-05-23 DIAGNOSIS — S0990XA Unspecified injury of head, initial encounter: Secondary | ICD-10-CM | POA: Diagnosis not present

## 2013-05-23 DIAGNOSIS — Z7982 Long term (current) use of aspirin: Secondary | ICD-10-CM | POA: Insufficient documentation

## 2013-05-23 DIAGNOSIS — M129 Arthropathy, unspecified: Secondary | ICD-10-CM | POA: Diagnosis not present

## 2013-05-23 DIAGNOSIS — S79919A Unspecified injury of unspecified hip, initial encounter: Secondary | ICD-10-CM | POA: Diagnosis not present

## 2013-05-23 DIAGNOSIS — S82899A Other fracture of unspecified lower leg, initial encounter for closed fracture: Secondary | ICD-10-CM | POA: Insufficient documentation

## 2013-05-23 DIAGNOSIS — S82209A Unspecified fracture of shaft of unspecified tibia, initial encounter for closed fracture: Secondary | ICD-10-CM | POA: Diagnosis not present

## 2013-05-23 DIAGNOSIS — J449 Chronic obstructive pulmonary disease, unspecified: Secondary | ICD-10-CM | POA: Diagnosis not present

## 2013-05-23 DIAGNOSIS — T148XXA Other injury of unspecified body region, initial encounter: Secondary | ICD-10-CM | POA: Diagnosis not present

## 2013-05-23 DIAGNOSIS — M810 Age-related osteoporosis without current pathological fracture: Secondary | ICD-10-CM | POA: Diagnosis not present

## 2013-05-23 DIAGNOSIS — Y9301 Activity, walking, marching and hiking: Secondary | ICD-10-CM | POA: Insufficient documentation

## 2013-05-23 DIAGNOSIS — M549 Dorsalgia, unspecified: Secondary | ICD-10-CM | POA: Diagnosis not present

## 2013-05-23 DIAGNOSIS — IMO0002 Reserved for concepts with insufficient information to code with codable children: Secondary | ICD-10-CM | POA: Insufficient documentation

## 2013-05-23 DIAGNOSIS — F17200 Nicotine dependence, unspecified, uncomplicated: Secondary | ICD-10-CM | POA: Diagnosis not present

## 2013-05-23 DIAGNOSIS — J4489 Other specified chronic obstructive pulmonary disease: Secondary | ICD-10-CM | POA: Insufficient documentation

## 2013-05-23 DIAGNOSIS — W010XXA Fall on same level from slipping, tripping and stumbling without subsequent striking against object, initial encounter: Secondary | ICD-10-CM | POA: Insufficient documentation

## 2013-05-23 DIAGNOSIS — Z79899 Other long term (current) drug therapy: Secondary | ICD-10-CM | POA: Insufficient documentation

## 2013-05-23 DIAGNOSIS — Y92009 Unspecified place in unspecified non-institutional (private) residence as the place of occurrence of the external cause: Secondary | ICD-10-CM | POA: Insufficient documentation

## 2013-05-23 DIAGNOSIS — M546 Pain in thoracic spine: Secondary | ICD-10-CM | POA: Diagnosis not present

## 2013-05-23 DIAGNOSIS — M25559 Pain in unspecified hip: Secondary | ICD-10-CM | POA: Diagnosis not present

## 2013-05-23 DIAGNOSIS — S82402A Unspecified fracture of shaft of left fibula, initial encounter for closed fracture: Secondary | ICD-10-CM

## 2013-05-23 DIAGNOSIS — Z880 Allergy status to penicillin: Secondary | ICD-10-CM | POA: Diagnosis not present

## 2013-05-23 DIAGNOSIS — S298XXA Other specified injuries of thorax, initial encounter: Secondary | ICD-10-CM | POA: Diagnosis not present

## 2013-05-23 DIAGNOSIS — S0993XA Unspecified injury of face, initial encounter: Secondary | ICD-10-CM | POA: Diagnosis not present

## 2013-05-23 DIAGNOSIS — R079 Chest pain, unspecified: Secondary | ICD-10-CM | POA: Diagnosis not present

## 2013-05-23 LAB — BASIC METABOLIC PANEL
Chloride: 98 mEq/L (ref 96–112)
Creatinine, Ser: 0.77 mg/dL (ref 0.50–1.10)
GFR calc Af Amer: >90 mL/min (ref >90)
GFR calc non Af Amer: 85 mL/min — ABNORMAL LOW (ref >90)

## 2013-05-23 MED ORDER — ONDANSETRON HCL 4 MG/2ML IJ SOLN
4.0000 mg | Freq: Once | INTRAMUSCULAR | Status: AC
  Administered 2013-05-23: 4 mg via INTRAVENOUS
  Filled 2013-05-23: qty 2

## 2013-05-23 MED ORDER — FENTANYL CITRATE 0.05 MG/ML IJ SOLN
50.0000 ug | Freq: Once | INTRAMUSCULAR | Status: AC
  Administered 2013-05-23: 50 ug via INTRAVENOUS
  Filled 2013-05-23: qty 2

## 2013-05-23 MED ORDER — MORPHINE SULFATE 2 MG/ML IJ SOLN
2.0000 mg | Freq: Once | INTRAMUSCULAR | Status: AC
  Administered 2013-05-23: 2 mg via INTRAVENOUS
  Filled 2013-05-23: qty 1

## 2013-05-23 NOTE — Progress Notes (Signed)
Cm spoke with Jermaine of [facility] to obtain wheel chair and 3N1 for non weight bearing pt Ht 5 foot wt 107 pounds CM received a return call from [facility] stating DME order completed

## 2013-05-23 NOTE — Progress Notes (Signed)
05/23/2013 A. Harvel Meskill RNCM 2122pm Received call from patient's friend Darl Pikes stating equipment has not shown up yet and that she will be leaving patient's house shortly.  This EDCM called [facility] again and spoke to [facility] who noted patient has not received her equipment and paged sent out again to [facility] delivery service.  EDCM called patient's friend Darl Pikes back.

## 2013-05-23 NOTE — Progress Notes (Signed)
05/23/2013 A. Ceria Suminski RNCM 2040pm EDCM spoke to patient's friend Darl Pikes for approximately twenty minuets.  Darl Pikes explained that patient reports she "doesn't think she can do it at home and that she would like rehab." [facility] explained to patient's friend again that [facility] has set up a visiting RN, PT, OT, aide and Child psychotherapist, for placement if needed.  Also has placed a referral to [facility].  Reminded patient's friend Darl Pikes of private duty nursing services which would be an out of pocket expense for patient but maybe family anf friends could help.  Darl Pikes requesting [facility] place another call to [facility], which EDCM has done at 2040pm and spoke to [place].  Chip Boer confirmed patient's information and took phone number for Darl Pikes again and stated she would page them immediately

## 2013-05-23 NOTE — ED Notes (Signed)
[facility] called for transport.  

## 2013-05-23 NOTE — Progress Notes (Signed)
05/22/2013 A. Skarlette Lattner RNCM 1734pm [facility] received permission to call patient's friend Darl Pikes at phone number [phone].  As per Darl Pikes, she is a family friend.  "I will do anything and everything I can possibly do for her.  I go to the store for and run errands for her but I also have three children and a disabled child to take care of.  I will be checking in on her later." [facility] also spoke to patient's daughter [name] and informed her that patient will be going home this evening, she will have home health services by [facility], she will have a three in one and a wheelchair at home and patient will be taken home by ambulance.  Informed patient's daughter that [facility] provided list of private duty nursing services and explained these services.  EDCM also asked patient's daughter to call patient's family (cousins) next door to help in patient's care.  Patient's daughter [name] said she would do so.  Patient's daughter [name] and patient's friend aware patient has a splint to her left leg and is non weight bearing.  Patient's daughter provided phone numbers to patient's niece Dois Davenport and her sister [phone] to make them aware of patient coming home and needing assistance.  EDCM unable to reach both patient's niece Dois Davenport and her sister.  EDCM called patient's daughter back to make her aware was unable to reach them and if she could call them.  Patient chose [facility] for home health services as this is the agency her pcp Dr. Andi Devon has chosen for her.  EDRN called [facility] to take patient home.  EDCM spoke to [facility] dme rep Jermaine who stated three in one and wheelchair will be delivered to patient's house this evening.  EDCM will also make refferal to [facility].  No further CM needs at this time.

## 2013-05-23 NOTE — Progress Notes (Signed)
05/23/2013 A. Jlon Betker RNCM 1905pm Received phone call from patient's daughter [name] saying, "My hands are tied, I just can't get anyone to stay with her. The equipment people are calling asking me if someone can be at the house to sign for the equipment. I can't be there."  EDCM reminded patient's daughter that [facility] has given patient a list of private duty nursing agencies and being that it would be an out of pocket expense maybe family and friends may want to help if patient was unable too pay for it. Patient's daughter asking why patient didn't stay in hospital to go to a rehab.  EDCM explained to patient's daughter that patient refused to go to a rehab. Also explained that at this point in time, patient does not meet inpatient criteria and  patient's Medicare insurance would not cover rehab expense, it would be an out of pocket expense for the patient. EDCM also spoke to patient's friend Darl Pikes who states it would be okay to give [facility] dme delivery people her phone number since she will be going over to patient's house this pm around eight pm and can sign for the equipment.  EDCM called [phone], [facility], to let them know to leave patient's door unlocked so that patient's friend can walk in this evening.  As per [facility]," will leave them a note as they are still in transport and they will get it." [facility] spoke to [facility] at this time and provided phone number for patient's friend Darl Pikes and informed [facility] that Darl Pikes will be arriving at patient's home by eight o'clock or eight fifteen.

## 2013-05-23 NOTE — ED Provider Notes (Addendum)
CSN: [id number]     Arrival date & time 05/23/13  1200 History   First MD Initiated Contact with Patient 05/23/13 1212     Chief Complaint  Patient presents with  . Fall   (Consider location/radiation/quality/duration/timing/severity/associated sxs/prior Treatment) HPI Comments: Patient brought to the emergency department for evaluation of left leg injury. Patient reports that she tripped while walking in her kitchen yesterday and fell. She says that she felt her left ankle give out when she fell. She has been having pain and swelling in the ankle ever since. Patient reports that she has, but has severe pain, and ankle as well as pain in the left hip area. Has chronic hip pain, previously evaluated by Dr. Charlann Boxer at [facility], told she was not a surgical candidate. There was no loss of consciousness. Patient denies upper extremity injury. She does report pain in her neck and back area, but has chronic pain in these areas.  Patient is a 67 y.o. female presenting with fall.  Fall    Past Medical History  Diagnosis Date  . Arthritis   . Osteoporosis   . COPD (chronic obstructive pulmonary disease)    History reviewed. No pertinent past surgical history. No family history on file. History  Substance Use Topics  . Smoking status: Current Every Day Smoker -- 2.00 packs/day  . Smokeless tobacco: Never Used  . Alcohol Use: No   OB History   Grav Para Term Preterm Abortions TAB SAB Ect Mult Living                 Review of Systems  Musculoskeletal: Positive for arthralgias, back pain and neck pain.  All other systems reviewed and are negative.    Allergies  Aspirin; Ciprofloxacin; Contrast media; Erythromycin base; Gadolinium derivatives; Ibuprofen; and Penicillins  Home Medications   Current Outpatient Rx  Name  Route  Sig  Dispense  Refill  . acyclovir ointment (ZOVIRAX) 5 %   Topical   Apply 1 application topically every 3 (three) hours as needed (rash).           Marland Kitchen albuterol (PROVENTIL) (2.5 MG/3ML) 0.083% nebulizer solution   Nebulization   Take 2.5 mg by nebulization every 6 (six) hours as needed.         Marland Kitchen alprazolam (XANAX) 2 MG tablet   Oral   Take 6 mg by mouth at bedtime as needed for sleep.          Marland Kitchen aspirin EC 81 MG tablet   Oral   Take 81 mg by mouth daily.         . budesonide-formoterol (SYMBICORT) 80-4.5 MCG/ACT inhaler   Inhalation   Inhale 2 puffs into the lungs 2 (two) times daily.         . Cholecalciferol (VITAMIN D-3 PO)   Oral   Take 1 tablet by mouth daily.         . diphenoxylate-atropine (LOMOTIL) 2.5-0.025 MG per tablet   Oral   Take 1 tablet by mouth 4 (four) times daily as needed.         . docusate sodium (COLACE) 100 MG capsule   Oral   Take 100 mg by mouth daily as needed for constipation.         Marland Kitchen esomeprazole (NEXIUM) 40 MG capsule   Oral   Take 40 mg by mouth 2 (two) times daily.          . fentaNYL (DURAGESIC - DOSED MCG/HR) 50 MCG/HR  Transdermal   Place 1 patch onto the skin every other day.         . gabapentin (NEURONTIN) 600 MG tablet   Oral   Take 1,800-2,400 mg by mouth 3 (three) times daily.          Marland Kitchen oxyCODONE (OXYCONTIN) 10 MG 12 hr tablet   Oral   Take 40-60 mg by mouth every 12 (twelve) hours.         Marland Kitchen tiZANidine (ZANAFLEX) 4 MG tablet   Oral   Take 8-12 mg by mouth every 4 (four) hours as needed (muscle pain).          . traZODone (DESYREL) 50 MG tablet   Oral   Take 200 mg by mouth at bedtime.           BP 170/89  Pulse 81  Temp(Src) 98.7 F (37.1 C) (Oral)  Resp 16  SpO2 95% Physical Exam  Constitutional: She is oriented to person, place, and time. She appears well-developed and well-nourished. No distress.  HENT:  Head: Normocephalic and atraumatic.  Right Ear: Hearing normal.  Left Ear: Hearing normal.  Nose: Nose normal.  Mouth/Throat: Oropharynx is clear and moist and mucous membranes are normal.  Eyes: Conjunctivae and  EOM are normal. Pupils are equal, round, and reactive to light.  Neck: Normal range of motion. Neck supple.  Cardiovascular: Regular rhythm, S1 normal and S2 normal.  Exam reveals no gallop and no friction rub.   No murmur heard. Pulmonary/Chest: Effort normal and breath sounds normal. No respiratory distress. She exhibits no tenderness.  Abdominal: Soft. Normal appearance and bowel sounds are normal. There is no hepatosplenomegaly. There is no tenderness. There is no rebound, no guarding, no tenderness at McBurney's point and negative Murphy's sign. No hernia.  Musculoskeletal:       Left hip: She exhibits decreased range of motion and tenderness. She exhibits no deformity.       Left ankle: She exhibits decreased range of motion, swelling and ecchymosis. Tenderness.  Neurological: She is alert and oriented to person, place, and time. She has normal strength. No cranial nerve deficit or sensory deficit. Coordination normal. GCS eye subscore is 4. GCS verbal subscore is 5. GCS motor subscore is 6.  Skin: Skin is warm, dry and intact. No rash noted. No cyanosis.  Psychiatric: She has a normal mood and affect. Her speech is normal and behavior is normal. Thought content normal.    ED Course  Procedures (including critical care time) Labs Review Labs Reviewed  CBC WITH DIFFERENTIAL  BASIC METABOLIC PANEL   Imaging Review Dg Chest 1 View  05/23/2013   CLINICAL DATA:  Pain post trauma  EXAM: CHEST - 1 VIEW  COMPARISON:  December 02, 2008  FINDINGS: There is a degree of underlying emphysematous change. There is no edema or consolidation. Heart is upper normal in size with normal pulmonary vascularity. No adenopathy. No apparent bone lesions. No pneumothorax. There is thoracolumbar dextroscoliosis.  IMPRESSION: Underlying emphysematous change. No edema or consolidation. No demonstrable pneumothorax.   Electronically Signed   By: Bretta Bang M.D.   On: 05/23/2013 13:58   Dg Thoracic Spine 2  View  05/23/2013   CLINICAL DATA:  Fall.  Thoracic back pain.  EXAM: THORACIC SPINE - 2 VIEW  COMPARISON:  None.  FINDINGS: There is no evidence of thoracic spine fracture. Alignment is normal. No focal lytic or sclerotic bone lesions identified. Mild lower thoracolumbar dextroscoliosis noted.  IMPRESSION: No acute findings.  Mild lower thoracolumbar scoliosis.   Electronically Signed   By: Myles Rosenthal M.D.   On: 05/23/2013 14:03   Dg Lumbar Spine Complete  05/23/2013   CLINICAL DATA:  Fall with back pain.  EXAM: LUMBAR SPINE - COMPLETE 4+ VIEW  COMPARISON:  04/11/2009  FINDINGS: Exam demonstrates diffuse decreased bone density. There is minimal curvature of the lumbar spine convex to the right. Vertebral body heights are within normal. There is mild spondylosis present. There is disc space narrowing from the L3-4 level to the L5-S1 level. There is no compression fracture or subluxation.  IMPRESSION: No acute findings.  Mild spondylosis with disc disease from the L3-4 level to the L5-S1 level.   Electronically Signed   By: Elberta Fortis M.D.   On: 05/23/2013 14:01   Dg Hip Complete Left  05/23/2013   CLINICAL DATA:  Fall with left hip pain.  EXAM: LEFT HIP - COMPLETE 2+ VIEW  COMPARISON:  None.  FINDINGS: Exam demonstrates diffuse decreased bone density. There is no definite acute fracture or dislocation. There are mild degenerative changes of the spine and symphysis pubis joint.  IMPRESSION: No acute fracture or dislocation. Occult fracture may be missed in the setting of osteopenia. Consider MRI for more sensitive evaluation if symptoms persist.   Electronically Signed   By: Elberta Fortis M.D.   On: 05/23/2013 13:59   Dg Ankle Complete Left  05/23/2013   CLINICAL DATA:  Pain post trauma  EXAM: LEFT ANKLE COMPLETE - 3+ VIEW  COMPARISON:  None.  FINDINGS: Frontal, oblique, and lateral views were obtained. There is an incomplete obliquely oriented fracture of the distal fibular metaphysis. There is  marked swelling laterally.  No other fracture. Ankle mortise appears intact. No effusion. Bones are osteoporotic.  IMPRESSION: Incomplete obliquely oriented fracture distal fibula with soft tissue swelling laterally. Ankle mortise appears intact. Bones are osteoporotic.   Electronically Signed   By: Bretta Bang M.D.   On: 05/23/2013 13:59   Ct Head Wo Contrast  05/23/2013   CLINICAL DATA:  Fall  EXAM: CT HEAD WITHOUT CONTRAST  CT CERVICAL SPINE WITHOUT CONTRAST  TECHNIQUE: Multidetector CT imaging of the head and cervical spine was performed following the standard protocol without intravenous contrast. Multiplanar CT image reconstructions of the cervical spine were also generated.  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 Negative for intracranial hemorrhage. Ventricle size is normal. Mild chronic microvascular ischemic change in the white matter. No acute infarct or mass. Negative for skull fracture.  CT CERVICAL SPINE FINDINGS  Disc degeneration with endplate sclerosis and spurring and facet degeneration at C6-7. Mild disc degeneration at C4-5 and C5-6.  Negative for fracture.  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  Negative for fracture.  No acute intracranial abnormality. Aneurysm coiling right MCA region.   Electronically Signed   By: Marlan Palau M.D.   On: 05/23/2013 13:45   Ct Cervical Spine Wo Contrast  05/23/2013   CLINICAL DATA:  Fall  EXAM: CT HEAD WITHOUT CONTRAST  CT CERVICAL SPINE WITHOUT CONTRAST  TECHNIQUE: Multidetector CT imaging of the head and cervical spine was performed following the standard protocol without intravenous contrast. Multiplanar CT image reconstructions of the cervical spine were also generated.  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 Negative for intracranial hemorrhage. Ventricle  size is normal. Mild chronic microvascular ischemic change in the white matter. No acute infarct or mass. Negative for skull fracture.  CT CERVICAL SPINE FINDINGS  Disc degeneration with endplate sclerosis and spurring and facet degeneration at C6-7. Mild disc degeneration at C4-5 and C5-6.  Negative for fracture.  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  Negative for fracture.  No acute intracranial abnormality. Aneurysm coiling right MCA region.   Electronically Signed   By: Marlan Palau M.D.   On: 05/23/2013 13:45    EKG Interpretation   None       MDM  Diagnosis: Distal fibula fracture  Patient presents to the ER for evaluation after a fall which occurred last night. She has been ambulatory but having pain with walking primarily in the left ankle region. She had some pain in the left hip area as well, but the pain is chronic secondary to degenerative changes. Patient has arthritis throughout her spine and had some pain there as well. X-ray of thoracic and lumbar spine are negative for acute fracture. Hip also shows no evidence of acute fracture. I do not have suspicion for occult fracture, I do not feel that there is any need for a CT or MRI at this time.  Case discussed with Doctor Supple likely to grow orthopedics. He tells me that this is conservative management, no need for surgical intervention. Patient will be placed in a stirrup splint and followup as an outpatient. Case management has to evaluate the patient to help her with her needs at home, as she lives alone.    Gilda Crease, MD 05/23/13 5621  Gilda Crease, MD 05/23/13 [phone]

## 2013-05-23 NOTE — Progress Notes (Signed)
   CARE MANAGEMENT ED NOTE 05/23/2013  Patient:  [name] [name], [name] [name]   Account Number:  [id number]  Date Initiated:  05/23/2013  Documentation initiated by:  Radford Pax  Subjective/Objective Assessment:   Patient presents to Ed with pain in left lower extremity.     Subjective/Objective Assessment Detail:   Xray left ankle revealed Incomplete obliquely oriented fracture distal fibula with soft  tissue swelling laterally. Ankle mortise appears intact. Bones are  osteoporotic.     Action/Plan:   Action/Plan Detail:   Anticipated DC Date:       Status Recommendation to Physician:   Result of Recommendation:    Other ED Services  Consult Working Plan    DC Planning Services  CM consult  Other    Choice offered to / List presented to:            Status of service:  Completed, signed off  ED Comments:   ED Comments Detail:  EDCM spoke to patient at bedside.  As per patient, she lives by herself.  Patient reports she has, "Two sisters with alzheimer's, one is dead and the other one doesn't speak to her."  Patient reports she has a daughter, "But she doesn't live nearby and works six days a week." Patient reports she has a walker and a cane.  Patient states, "I can't use the cane because it is too high for me. And when I use the walker, I almost fella couple of times."  Patient reports she is right handed.  EDCM inquired about any neighbors or church members who can check in on her.  Patient reports, "I have cousins that live next door, but they have their own lives you know." EDCM explained to patient that she will be discharged home, that the physician is going to splint her ankle.  EDCM discussed patient with EDP who reports the orthapedic physician reports it is a nonoperative fracture, patient will need to be non weight bearing.  EDCM explained home health services to patient.  Expalined that she may receive a visiting RN, PT, OT, nursing aide and a Child psychotherapist if needed for  placement.  Explained Medicare guidelines to patient regarding qualifying inpatient stay to be admitted into a Rehab.  Patient stated, "I don't want to go into a rehab.  My sister has been in one since before thanksgiving and they have been taking her social security checks." [facility] also offered patient list of private duty nursing services and explained it may be an out of pocket expense for her. EDCM explained to patient that she would then have to be discharged to home.  [facility] ordered patient a wheelchair and three in one commode for home through [facility] dme.  Patient reports she was to have a meeting with Forde Radon today but she is in the hospital.  [facility] received permission from patient to contact her daughter.  EDCM left message for patient's daughter [name] [name] at [phone].  Awaiting call back.

## 2013-05-23 NOTE — ED Notes (Signed)
Bed: ZO10 Expected date:  Expected time:  Means of arrival:  Comments: EMS-fall-hip, ankle pain

## 2013-05-23 NOTE — Progress Notes (Signed)
05/23/2013 A. Monnie Anspach RNCM 2205pm Currently patient still does not have her equipment.  As per patient's friend Darl Pikes, "They finally called me back and told me that they would just bring her commode.  I told them no she needs the wheelchair to get around.  He [facility] delivery man) told me that he would have to drive all the way to [place] to get the wheelchair. He said he got like twelve notes in the computer.He was so rude."  Orders were placed for three in one and wheelchair at 1615pm, confirmed with Jermaine would be delivered to patient's house this evening.  EDCM apologized to patient's friend who will remain at patient's house until equipment arrives.  As per Darl Pikes, she will make sure patient has everything she needs, phone etc and keep it close to patient.

## 2013-05-23 NOTE — ED Notes (Addendum)
Patient fell last night on her left side.  Patient ambulated after fall.  No loss of conciousness.  Patient c/o left hip, lower back, and left ankle pain. Patient given 200 mcg of Fentanyl by EMS.

## 2013-05-23 NOTE — Progress Notes (Signed)
05/23/2013 A. Eduarda Scrivens RNCM 2132pm [facility] provided patient's friend Darl Pikes with [facility] phone number.

## 2013-05-24 NOTE — Progress Notes (Signed)
05/23/2013 A. Nansi Birmingham RNCM 1530pm Received phone call from patient's friend Darl Pikes requesting hospital bed for patient.  This EDCM called Belenda Cruise, transition specialist for [facility].  As per Belenda Cruise, [facility] will need a qualifying note for why patient needs a hospital bed with a physician order.  This EDCM called and left message for RN at patient's pcp's office Dr. Renae Gloss requesting order for hospital bed  to be sent to [facility] requesting call back as well.  [facility] also faxed qualifuing note (in case they didn't have one) to Dr. Mathews Robinsons office at [phone].  Will follow up with patient's friend Darl Pikes.

## 2013-05-24 NOTE — Progress Notes (Signed)
05/24/2013 A. Roda Lauture RNCM 1627pm Patient received phone call from [facility] RN Anibal Henderson confirming referral and will follow up with patient.

## 2013-05-24 NOTE — Progress Notes (Signed)
05/24/2013 A. Letta Cargile RNCM 1756pm EDCM called [facility] to see if they had received order for hospital bed from Dr. Mathews Robinsons office.  As per [facility], no order was received for a hospital bed.  [facility] called Dr. Mathews Robinsons office at this time, but it was closed.  EDCM notified patient's friend Darl Pikes.  Bernerd Limbo to follow up with patient's pcp on Monday.  No further CM needs at this time.

## 2013-05-24 NOTE — Progress Notes (Signed)
05/24/2013 A. Destin Vinsant RNCM 1546pm EDCM spoke to patient's friend Darl Pikes who confirms patient has received her three in one and her wheelchair.  Also confirmed [facility] will be coming out to see patient tomorrow.  Patient has not heard anything from [facility], [facility] will email Anibal Henderson to contact patient ASAP.  Informed patient's friend that request for hospital bed was placed into Dr. Mathews Robinsons office.  Patient's friend susan reports that patient is , "Hanging in there." Darl Pikes has been staying with Ms. Whitefield for a few hours daily.  Tia Masker for [facility] services

## 2013-05-25 DIAGNOSIS — Z9181 History of falling: Secondary | ICD-10-CM | POA: Diagnosis not present

## 2013-05-25 DIAGNOSIS — R269 Unspecified abnormalities of gait and mobility: Secondary | ICD-10-CM | POA: Diagnosis not present

## 2013-05-25 DIAGNOSIS — J449 Chronic obstructive pulmonary disease, unspecified: Secondary | ICD-10-CM | POA: Diagnosis not present

## 2013-05-25 DIAGNOSIS — F329 Major depressive disorder, single episode, unspecified: Secondary | ICD-10-CM | POA: Diagnosis not present

## 2013-05-25 DIAGNOSIS — IMO0001 Reserved for inherently not codable concepts without codable children: Secondary | ICD-10-CM | POA: Diagnosis not present

## 2013-05-25 DIAGNOSIS — J4489 Other specified chronic obstructive pulmonary disease: Secondary | ICD-10-CM | POA: Diagnosis not present

## 2013-05-25 DIAGNOSIS — M25559 Pain in unspecified hip: Secondary | ICD-10-CM | POA: Diagnosis not present

## 2013-05-28 NOTE — Progress Notes (Signed)
05/28/2013 A. Ferrero RNCM 1611pm EDCM followed up with patient's friend [name] [name] and the patient.  This EDCM has not received a phone call or a message from Dr. Mathews [name] office regarding the request for a hospital bed.  As per patient's friend [name] [name], she called Dr. Mathews [name] office 12/22, and has not received a phone call back.  EDCM spoke to patient who reports she has a follow up appointment with the orthopedic 12/24 at 0915am also has a follow up appointment with her pcp on o1/10/2013.  Patient reports, "I've been getting around with my walker.  People have been saying that I need crutches."  EDCM instructed patient to ask her doctor tomorrow about crutches.  Patient reports that she has told the physical therapist with [facility] to come and see her after the holidays.  Patient reports, "I have just been in so much pain, I didn't know if I could do it. I have been trying to change pain management doctors"  [facility] informed patient that with physical therapy, they will show and instruct her the proper way to move around so that it does not hurt her so much.  [facility] encouraged patient to call [facility] and schedule an appointment with PT.  Patient also reports that [facility] has followed up with her as well.  Patient still requesting hospital bed.  EDCM placed another call to Dr. Augustin Schooling office and left another message requesting an order for a hospital bed, leaving [facility] phone number on message. EDCM also instructed patient to call her pcp to request hospital bed.  Patient thankful for [facility] services.  No further CM needs at this time.

## 2013-05-29 DIAGNOSIS — Z9181 History of falling: Secondary | ICD-10-CM | POA: Diagnosis not present

## 2013-05-29 DIAGNOSIS — F329 Major depressive disorder, single episode, unspecified: Secondary | ICD-10-CM | POA: Diagnosis not present

## 2013-05-29 DIAGNOSIS — J449 Chronic obstructive pulmonary disease, unspecified: Secondary | ICD-10-CM | POA: Diagnosis not present

## 2013-05-29 DIAGNOSIS — S82409A Unspecified fracture of shaft of unspecified fibula, initial encounter for closed fracture: Secondary | ICD-10-CM | POA: Diagnosis not present

## 2013-05-29 DIAGNOSIS — M25559 Pain in unspecified hip: Secondary | ICD-10-CM | POA: Diagnosis not present

## 2013-05-29 DIAGNOSIS — R269 Unspecified abnormalities of gait and mobility: Secondary | ICD-10-CM | POA: Diagnosis not present

## 2013-05-29 DIAGNOSIS — IMO0001 Reserved for inherently not codable concepts without codable children: Secondary | ICD-10-CM | POA: Diagnosis not present

## 2013-05-31 DIAGNOSIS — J449 Chronic obstructive pulmonary disease, unspecified: Secondary | ICD-10-CM | POA: Diagnosis not present

## 2013-05-31 DIAGNOSIS — Z9181 History of falling: Secondary | ICD-10-CM | POA: Diagnosis not present

## 2013-05-31 DIAGNOSIS — R269 Unspecified abnormalities of gait and mobility: Secondary | ICD-10-CM | POA: Diagnosis not present

## 2013-05-31 DIAGNOSIS — M25559 Pain in unspecified hip: Secondary | ICD-10-CM | POA: Diagnosis not present

## 2013-05-31 DIAGNOSIS — F329 Major depressive disorder, single episode, unspecified: Secondary | ICD-10-CM | POA: Diagnosis not present

## 2013-05-31 DIAGNOSIS — IMO0001 Reserved for inherently not codable concepts without codable children: Secondary | ICD-10-CM | POA: Diagnosis not present

## 2013-06-06 DIAGNOSIS — J449 Chronic obstructive pulmonary disease, unspecified: Secondary | ICD-10-CM | POA: Diagnosis not present

## 2013-06-06 DIAGNOSIS — R269 Unspecified abnormalities of gait and mobility: Secondary | ICD-10-CM | POA: Diagnosis not present

## 2013-06-06 DIAGNOSIS — F329 Major depressive disorder, single episode, unspecified: Secondary | ICD-10-CM | POA: Diagnosis not present

## 2013-06-06 DIAGNOSIS — F3289 Other specified depressive episodes: Secondary | ICD-10-CM | POA: Diagnosis not present

## 2013-06-06 DIAGNOSIS — Z9181 History of falling: Secondary | ICD-10-CM | POA: Diagnosis not present

## 2013-06-06 DIAGNOSIS — IMO0001 Reserved for inherently not codable concepts without codable children: Secondary | ICD-10-CM | POA: Diagnosis not present

## 2013-06-06 DIAGNOSIS — M25559 Pain in unspecified hip: Secondary | ICD-10-CM | POA: Diagnosis not present

## 2013-06-07 DIAGNOSIS — M25559 Pain in unspecified hip: Secondary | ICD-10-CM | POA: Diagnosis not present

## 2013-06-07 DIAGNOSIS — IMO0001 Reserved for inherently not codable concepts without codable children: Secondary | ICD-10-CM | POA: Diagnosis not present

## 2013-06-07 DIAGNOSIS — F3289 Other specified depressive episodes: Secondary | ICD-10-CM | POA: Diagnosis not present

## 2013-06-07 DIAGNOSIS — J449 Chronic obstructive pulmonary disease, unspecified: Secondary | ICD-10-CM | POA: Diagnosis not present

## 2013-06-07 DIAGNOSIS — F329 Major depressive disorder, single episode, unspecified: Secondary | ICD-10-CM | POA: Diagnosis not present

## 2013-06-07 DIAGNOSIS — Z9181 History of falling: Secondary | ICD-10-CM | POA: Diagnosis not present

## 2013-06-07 DIAGNOSIS — R269 Unspecified abnormalities of gait and mobility: Secondary | ICD-10-CM | POA: Diagnosis not present

## 2013-06-10 DIAGNOSIS — F17200 Nicotine dependence, unspecified, uncomplicated: Secondary | ICD-10-CM | POA: Diagnosis not present

## 2013-06-10 DIAGNOSIS — J449 Chronic obstructive pulmonary disease, unspecified: Secondary | ICD-10-CM | POA: Diagnosis not present

## 2013-06-10 DIAGNOSIS — F411 Generalized anxiety disorder: Secondary | ICD-10-CM | POA: Diagnosis not present

## 2013-06-10 DIAGNOSIS — M25559 Pain in unspecified hip: Secondary | ICD-10-CM | POA: Diagnosis not present

## 2013-06-12 DIAGNOSIS — IMO0001 Reserved for inherently not codable concepts without codable children: Secondary | ICD-10-CM | POA: Diagnosis not present

## 2013-06-12 DIAGNOSIS — F329 Major depressive disorder, single episode, unspecified: Secondary | ICD-10-CM | POA: Diagnosis not present

## 2013-06-12 DIAGNOSIS — R269 Unspecified abnormalities of gait and mobility: Secondary | ICD-10-CM | POA: Diagnosis not present

## 2013-06-12 DIAGNOSIS — J449 Chronic obstructive pulmonary disease, unspecified: Secondary | ICD-10-CM | POA: Diagnosis not present

## 2013-06-12 DIAGNOSIS — F3289 Other specified depressive episodes: Secondary | ICD-10-CM | POA: Diagnosis not present

## 2013-06-12 DIAGNOSIS — M25559 Pain in unspecified hip: Secondary | ICD-10-CM | POA: Diagnosis not present

## 2013-06-12 DIAGNOSIS — Z9181 History of falling: Secondary | ICD-10-CM | POA: Diagnosis not present

## 2013-06-13 DIAGNOSIS — M47817 Spondylosis without myelopathy or radiculopathy, lumbosacral region: Secondary | ICD-10-CM | POA: Diagnosis not present

## 2013-06-13 DIAGNOSIS — IMO0002 Reserved for concepts with insufficient information to code with codable children: Secondary | ICD-10-CM | POA: Diagnosis not present

## 2013-06-13 DIAGNOSIS — M1610 Unilateral primary osteoarthritis, unspecified hip: Secondary | ICD-10-CM | POA: Diagnosis not present

## 2013-06-19 DIAGNOSIS — R269 Unspecified abnormalities of gait and mobility: Secondary | ICD-10-CM | POA: Diagnosis not present

## 2013-06-19 DIAGNOSIS — J449 Chronic obstructive pulmonary disease, unspecified: Secondary | ICD-10-CM | POA: Diagnosis not present

## 2013-06-19 DIAGNOSIS — IMO0001 Reserved for inherently not codable concepts without codable children: Secondary | ICD-10-CM | POA: Diagnosis not present

## 2013-06-19 DIAGNOSIS — Z9181 History of falling: Secondary | ICD-10-CM | POA: Diagnosis not present

## 2013-06-19 DIAGNOSIS — F329 Major depressive disorder, single episode, unspecified: Secondary | ICD-10-CM | POA: Diagnosis not present

## 2013-06-19 DIAGNOSIS — F3289 Other specified depressive episodes: Secondary | ICD-10-CM | POA: Diagnosis not present

## 2013-06-19 DIAGNOSIS — M25559 Pain in unspecified hip: Secondary | ICD-10-CM | POA: Diagnosis not present

## 2013-06-26 DIAGNOSIS — F3289 Other specified depressive episodes: Secondary | ICD-10-CM | POA: Diagnosis not present

## 2013-06-26 DIAGNOSIS — M25559 Pain in unspecified hip: Secondary | ICD-10-CM | POA: Diagnosis not present

## 2013-06-26 DIAGNOSIS — R269 Unspecified abnormalities of gait and mobility: Secondary | ICD-10-CM | POA: Diagnosis not present

## 2013-06-26 DIAGNOSIS — Z9181 History of falling: Secondary | ICD-10-CM | POA: Diagnosis not present

## 2013-06-26 DIAGNOSIS — F329 Major depressive disorder, single episode, unspecified: Secondary | ICD-10-CM | POA: Diagnosis not present

## 2013-06-26 DIAGNOSIS — IMO0001 Reserved for inherently not codable concepts without codable children: Secondary | ICD-10-CM | POA: Diagnosis not present

## 2013-06-26 DIAGNOSIS — J449 Chronic obstructive pulmonary disease, unspecified: Secondary | ICD-10-CM | POA: Diagnosis not present

## 2013-07-01 DIAGNOSIS — S82409A Unspecified fracture of shaft of unspecified fibula, initial encounter for closed fracture: Secondary | ICD-10-CM | POA: Diagnosis not present

## 2013-07-01 DIAGNOSIS — S8290XD Unspecified fracture of unspecified lower leg, subsequent encounter for closed fracture with routine healing: Secondary | ICD-10-CM | POA: Diagnosis not present

## 2013-07-03 DIAGNOSIS — Z9181 History of falling: Secondary | ICD-10-CM | POA: Diagnosis not present

## 2013-07-03 DIAGNOSIS — F3289 Other specified depressive episodes: Secondary | ICD-10-CM | POA: Diagnosis not present

## 2013-07-03 DIAGNOSIS — F329 Major depressive disorder, single episode, unspecified: Secondary | ICD-10-CM | POA: Diagnosis not present

## 2013-07-03 DIAGNOSIS — J449 Chronic obstructive pulmonary disease, unspecified: Secondary | ICD-10-CM | POA: Diagnosis not present

## 2013-07-03 DIAGNOSIS — R269 Unspecified abnormalities of gait and mobility: Secondary | ICD-10-CM | POA: Diagnosis not present

## 2013-07-03 DIAGNOSIS — M25559 Pain in unspecified hip: Secondary | ICD-10-CM | POA: Diagnosis not present

## 2013-07-03 DIAGNOSIS — IMO0001 Reserved for inherently not codable concepts without codable children: Secondary | ICD-10-CM | POA: Diagnosis not present

## 2013-07-05 DIAGNOSIS — M25559 Pain in unspecified hip: Secondary | ICD-10-CM | POA: Diagnosis not present

## 2013-07-05 DIAGNOSIS — F329 Major depressive disorder, single episode, unspecified: Secondary | ICD-10-CM | POA: Diagnosis not present

## 2013-07-05 DIAGNOSIS — R269 Unspecified abnormalities of gait and mobility: Secondary | ICD-10-CM | POA: Diagnosis not present

## 2013-07-05 DIAGNOSIS — IMO0001 Reserved for inherently not codable concepts without codable children: Secondary | ICD-10-CM | POA: Diagnosis not present

## 2013-07-05 DIAGNOSIS — F3289 Other specified depressive episodes: Secondary | ICD-10-CM | POA: Diagnosis not present

## 2013-07-05 DIAGNOSIS — J449 Chronic obstructive pulmonary disease, unspecified: Secondary | ICD-10-CM | POA: Diagnosis not present

## 2013-07-05 DIAGNOSIS — Z9181 History of falling: Secondary | ICD-10-CM | POA: Diagnosis not present

## 2013-07-09 DIAGNOSIS — Z9181 History of falling: Secondary | ICD-10-CM | POA: Diagnosis not present

## 2013-07-09 DIAGNOSIS — J449 Chronic obstructive pulmonary disease, unspecified: Secondary | ICD-10-CM | POA: Diagnosis not present

## 2013-07-09 DIAGNOSIS — IMO0001 Reserved for inherently not codable concepts without codable children: Secondary | ICD-10-CM | POA: Diagnosis not present

## 2013-07-09 DIAGNOSIS — R269 Unspecified abnormalities of gait and mobility: Secondary | ICD-10-CM | POA: Diagnosis not present

## 2013-07-09 DIAGNOSIS — F329 Major depressive disorder, single episode, unspecified: Secondary | ICD-10-CM | POA: Diagnosis not present

## 2013-07-09 DIAGNOSIS — F3289 Other specified depressive episodes: Secondary | ICD-10-CM | POA: Diagnosis not present

## 2013-07-09 DIAGNOSIS — M25559 Pain in unspecified hip: Secondary | ICD-10-CM | POA: Diagnosis not present

## 2013-07-11 DIAGNOSIS — F3289 Other specified depressive episodes: Secondary | ICD-10-CM | POA: Diagnosis not present

## 2013-07-11 DIAGNOSIS — J449 Chronic obstructive pulmonary disease, unspecified: Secondary | ICD-10-CM | POA: Diagnosis not present

## 2013-07-11 DIAGNOSIS — F329 Major depressive disorder, single episode, unspecified: Secondary | ICD-10-CM | POA: Diagnosis not present

## 2013-07-11 DIAGNOSIS — IMO0001 Reserved for inherently not codable concepts without codable children: Secondary | ICD-10-CM | POA: Diagnosis not present

## 2013-07-11 DIAGNOSIS — M25559 Pain in unspecified hip: Secondary | ICD-10-CM | POA: Diagnosis not present

## 2013-07-11 DIAGNOSIS — Z9181 History of falling: Secondary | ICD-10-CM | POA: Diagnosis not present

## 2013-07-11 DIAGNOSIS — R269 Unspecified abnormalities of gait and mobility: Secondary | ICD-10-CM | POA: Diagnosis not present

## 2013-07-16 DIAGNOSIS — R269 Unspecified abnormalities of gait and mobility: Secondary | ICD-10-CM | POA: Diagnosis not present

## 2013-07-16 DIAGNOSIS — F3289 Other specified depressive episodes: Secondary | ICD-10-CM | POA: Diagnosis not present

## 2013-07-16 DIAGNOSIS — F329 Major depressive disorder, single episode, unspecified: Secondary | ICD-10-CM | POA: Diagnosis not present

## 2013-07-16 DIAGNOSIS — M25559 Pain in unspecified hip: Secondary | ICD-10-CM | POA: Diagnosis not present

## 2013-07-16 DIAGNOSIS — Z9181 History of falling: Secondary | ICD-10-CM | POA: Diagnosis not present

## 2013-07-16 DIAGNOSIS — IMO0001 Reserved for inherently not codable concepts without codable children: Secondary | ICD-10-CM | POA: Diagnosis not present

## 2013-07-16 DIAGNOSIS — J449 Chronic obstructive pulmonary disease, unspecified: Secondary | ICD-10-CM | POA: Diagnosis not present

## 2013-07-18 DIAGNOSIS — IMO0002 Reserved for concepts with insufficient information to code with codable children: Secondary | ICD-10-CM | POA: Diagnosis not present

## 2013-07-18 DIAGNOSIS — M1610 Unilateral primary osteoarthritis, unspecified hip: Secondary | ICD-10-CM | POA: Diagnosis not present

## 2013-07-18 DIAGNOSIS — M47817 Spondylosis without myelopathy or radiculopathy, lumbosacral region: Secondary | ICD-10-CM | POA: Diagnosis not present

## 2013-07-18 DIAGNOSIS — M169 Osteoarthritis of hip, unspecified: Secondary | ICD-10-CM | POA: Diagnosis not present

## 2013-07-19 DIAGNOSIS — J449 Chronic obstructive pulmonary disease, unspecified: Secondary | ICD-10-CM | POA: Diagnosis not present

## 2013-07-19 DIAGNOSIS — F3289 Other specified depressive episodes: Secondary | ICD-10-CM | POA: Diagnosis not present

## 2013-07-19 DIAGNOSIS — R269 Unspecified abnormalities of gait and mobility: Secondary | ICD-10-CM | POA: Diagnosis not present

## 2013-07-19 DIAGNOSIS — M25559 Pain in unspecified hip: Secondary | ICD-10-CM | POA: Diagnosis not present

## 2013-07-19 DIAGNOSIS — Z9181 History of falling: Secondary | ICD-10-CM | POA: Diagnosis not present

## 2013-07-19 DIAGNOSIS — IMO0001 Reserved for inherently not codable concepts without codable children: Secondary | ICD-10-CM | POA: Diagnosis not present

## 2013-07-19 DIAGNOSIS — F329 Major depressive disorder, single episode, unspecified: Secondary | ICD-10-CM | POA: Diagnosis not present

## 2013-07-23 DIAGNOSIS — R269 Unspecified abnormalities of gait and mobility: Secondary | ICD-10-CM | POA: Diagnosis not present

## 2013-07-23 DIAGNOSIS — J449 Chronic obstructive pulmonary disease, unspecified: Secondary | ICD-10-CM | POA: Diagnosis not present

## 2013-07-23 DIAGNOSIS — F329 Major depressive disorder, single episode, unspecified: Secondary | ICD-10-CM | POA: Diagnosis not present

## 2013-07-23 DIAGNOSIS — F3289 Other specified depressive episodes: Secondary | ICD-10-CM | POA: Diagnosis not present

## 2013-07-23 DIAGNOSIS — IMO0001 Reserved for inherently not codable concepts without codable children: Secondary | ICD-10-CM | POA: Diagnosis not present

## 2013-07-23 DIAGNOSIS — Z9181 History of falling: Secondary | ICD-10-CM | POA: Diagnosis not present

## 2013-07-23 DIAGNOSIS — M25559 Pain in unspecified hip: Secondary | ICD-10-CM | POA: Diagnosis not present

## 2013-07-24 DIAGNOSIS — R269 Unspecified abnormalities of gait and mobility: Secondary | ICD-10-CM | POA: Diagnosis not present

## 2013-07-24 DIAGNOSIS — M6281 Muscle weakness (generalized): Secondary | ICD-10-CM | POA: Diagnosis not present

## 2013-07-24 DIAGNOSIS — IMO0001 Reserved for inherently not codable concepts without codable children: Secondary | ICD-10-CM | POA: Diagnosis not present

## 2013-07-26 DIAGNOSIS — S82409A Unspecified fracture of shaft of unspecified fibula, initial encounter for closed fracture: Secondary | ICD-10-CM | POA: Diagnosis not present

## 2013-07-26 DIAGNOSIS — S8290XD Unspecified fracture of unspecified lower leg, subsequent encounter for closed fracture with routine healing: Secondary | ICD-10-CM | POA: Diagnosis not present

## 2013-08-26 DIAGNOSIS — S82409A Unspecified fracture of shaft of unspecified fibula, initial encounter for closed fracture: Secondary | ICD-10-CM | POA: Diagnosis not present

## 2013-09-14 DIAGNOSIS — G8929 Other chronic pain: Secondary | ICD-10-CM | POA: Diagnosis not present

## 2013-09-14 DIAGNOSIS — Z0000 Encounter for general adult medical examination without abnormal findings: Secondary | ICD-10-CM | POA: Diagnosis not present

## 2013-09-14 DIAGNOSIS — J449 Chronic obstructive pulmonary disease, unspecified: Secondary | ICD-10-CM | POA: Diagnosis not present

## 2013-09-14 DIAGNOSIS — E559 Vitamin D deficiency, unspecified: Secondary | ICD-10-CM | POA: Diagnosis not present

## 2013-09-14 DIAGNOSIS — F3289 Other specified depressive episodes: Secondary | ICD-10-CM | POA: Diagnosis not present

## 2013-09-14 DIAGNOSIS — Z79899 Other long term (current) drug therapy: Secondary | ICD-10-CM | POA: Diagnosis not present

## 2013-09-19 DIAGNOSIS — M1610 Unilateral primary osteoarthritis, unspecified hip: Secondary | ICD-10-CM | POA: Diagnosis not present

## 2013-09-19 DIAGNOSIS — IMO0002 Reserved for concepts with insufficient information to code with codable children: Secondary | ICD-10-CM | POA: Diagnosis not present

## 2013-09-19 DIAGNOSIS — M169 Osteoarthritis of hip, unspecified: Secondary | ICD-10-CM | POA: Diagnosis not present

## 2013-09-19 DIAGNOSIS — M47817 Spondylosis without myelopathy or radiculopathy, lumbosacral region: Secondary | ICD-10-CM | POA: Diagnosis not present

## 2013-10-10 DIAGNOSIS — F3289 Other specified depressive episodes: Secondary | ICD-10-CM | POA: Diagnosis not present

## 2013-10-10 DIAGNOSIS — J449 Chronic obstructive pulmonary disease, unspecified: Secondary | ICD-10-CM | POA: Diagnosis not present

## 2013-10-10 DIAGNOSIS — G8929 Other chronic pain: Secondary | ICD-10-CM | POA: Diagnosis not present

## 2013-10-10 DIAGNOSIS — M810 Age-related osteoporosis without current pathological fracture: Secondary | ICD-10-CM | POA: Diagnosis not present

## 2013-10-17 DIAGNOSIS — M1610 Unilateral primary osteoarthritis, unspecified hip: Secondary | ICD-10-CM | POA: Diagnosis not present

## 2013-10-17 DIAGNOSIS — M169 Osteoarthritis of hip, unspecified: Secondary | ICD-10-CM | POA: Diagnosis not present

## 2013-10-17 DIAGNOSIS — IMO0002 Reserved for concepts with insufficient information to code with codable children: Secondary | ICD-10-CM | POA: Diagnosis not present

## 2013-10-17 DIAGNOSIS — M47817 Spondylosis without myelopathy or radiculopathy, lumbosacral region: Secondary | ICD-10-CM | POA: Diagnosis not present

## 2013-10-21 DIAGNOSIS — M7989 Other specified soft tissue disorders: Secondary | ICD-10-CM | POA: Diagnosis not present

## 2013-11-06 ENCOUNTER — Emergency Department (HOSPITAL_COMMUNITY): Payer: Medicare Other

## 2013-11-06 ENCOUNTER — Emergency Department (HOSPITAL_COMMUNITY)
Admission: EM | Admit: 2013-11-06 | Discharge: 2013-11-06 | Disposition: A | Discharge status: Home / Self Care | Payer: Medicare Other | Attending: Emergency Medicine | Admitting: Emergency Medicine

## 2013-11-06 ENCOUNTER — Encounter (HOSPITAL_COMMUNITY): Payer: Self-pay | Admitting: Emergency Medicine

## 2013-11-06 DIAGNOSIS — J449 Chronic obstructive pulmonary disease, unspecified: Secondary | ICD-10-CM | POA: Insufficient documentation

## 2013-11-06 DIAGNOSIS — F17200 Nicotine dependence, unspecified, uncomplicated: Secondary | ICD-10-CM | POA: Insufficient documentation

## 2013-11-06 DIAGNOSIS — M129 Arthropathy, unspecified: Secondary | ICD-10-CM | POA: Diagnosis not present

## 2013-11-06 DIAGNOSIS — IMO0002 Reserved for concepts with insufficient information to code with codable children: Secondary | ICD-10-CM | POA: Insufficient documentation

## 2013-11-06 DIAGNOSIS — S79919A Unspecified injury of unspecified hip, initial encounter: Secondary | ICD-10-CM | POA: Diagnosis not present

## 2013-11-06 DIAGNOSIS — Z9181 History of falling: Secondary | ICD-10-CM | POA: Diagnosis not present

## 2013-11-06 DIAGNOSIS — S0993XA Unspecified injury of face, initial encounter: Secondary | ICD-10-CM | POA: Diagnosis not present

## 2013-11-06 DIAGNOSIS — S79929A Unspecified injury of unspecified thigh, initial encounter: Secondary | ICD-10-CM

## 2013-11-06 DIAGNOSIS — S199XXA Unspecified injury of neck, initial encounter: Secondary | ICD-10-CM | POA: Diagnosis not present

## 2013-11-06 DIAGNOSIS — M25559 Pain in unspecified hip: Secondary | ICD-10-CM | POA: Diagnosis not present

## 2013-11-06 DIAGNOSIS — J4489 Other specified chronic obstructive pulmonary disease: Secondary | ICD-10-CM | POA: Insufficient documentation

## 2013-11-06 DIAGNOSIS — S0100XA Unspecified open wound of scalp, initial encounter: Secondary | ICD-10-CM | POA: Diagnosis not present

## 2013-11-06 DIAGNOSIS — Z23 Encounter for immunization: Secondary | ICD-10-CM | POA: Diagnosis not present

## 2013-11-06 DIAGNOSIS — Z79899 Other long term (current) drug therapy: Secondary | ICD-10-CM | POA: Diagnosis not present

## 2013-11-06 DIAGNOSIS — M810 Age-related osteoporosis without current pathological fracture: Secondary | ICD-10-CM | POA: Insufficient documentation

## 2013-11-06 DIAGNOSIS — R630 Anorexia: Secondary | ICD-10-CM | POA: Insufficient documentation

## 2013-11-06 DIAGNOSIS — Y939 Activity, unspecified: Secondary | ICD-10-CM | POA: Insufficient documentation

## 2013-11-06 DIAGNOSIS — Z880 Allergy status to penicillin: Secondary | ICD-10-CM | POA: Diagnosis not present

## 2013-11-06 DIAGNOSIS — R42 Dizziness and giddiness: Secondary | ICD-10-CM | POA: Diagnosis not present

## 2013-11-06 DIAGNOSIS — Y929 Unspecified place or not applicable: Secondary | ICD-10-CM | POA: Insufficient documentation

## 2013-11-06 DIAGNOSIS — S0101XA Laceration without foreign body of scalp, initial encounter: Secondary | ICD-10-CM

## 2013-11-06 DIAGNOSIS — T1490XA Injury, unspecified, initial encounter: Secondary | ICD-10-CM | POA: Diagnosis not present

## 2013-11-06 DIAGNOSIS — Z7982 Long term (current) use of aspirin: Secondary | ICD-10-CM | POA: Diagnosis not present

## 2013-11-06 DIAGNOSIS — M25551 Pain in right hip: Secondary | ICD-10-CM

## 2013-11-06 DIAGNOSIS — S0990XA Unspecified injury of head, initial encounter: Secondary | ICD-10-CM | POA: Insufficient documentation

## 2013-11-06 DIAGNOSIS — W19XXXA Unspecified fall, initial encounter: Secondary | ICD-10-CM

## 2013-11-06 DIAGNOSIS — W1809XA Striking against other object with subsequent fall, initial encounter: Secondary | ICD-10-CM | POA: Insufficient documentation

## 2013-11-06 LAB — URINALYSIS, ROUTINE W REFLEX MICROSCOPIC
BILIRUBIN URINE: NEGATIVE
GLUCOSE, UA: NEGATIVE mg/dL
Hgb urine dipstick: NEGATIVE
KETONES UR: NEGATIVE mg/dL
Leukocytes, UA: NEGATIVE
Nitrite: NEGATIVE
Protein, ur: NEGATIVE mg/dL
Specific Gravity, Urine: 1.01 (ref 1.005–1.030)
Urobilinogen, UA: 0.2 mg/dL (ref 0.0–1.0)
pH: 6.5 (ref 5.0–8.0)

## 2013-11-06 LAB — CBC WITH DIFFERENTIAL/PLATELET
Basophils Absolute: 0 10*3/uL (ref 0.0–0.1)
Basophils Relative: 1 % (ref 0–1)
EOS ABS: 0.1 10*3/uL (ref 0.0–0.7)
EOS PCT: 1 % (ref 0–5)
HEMATOCRIT: 41.4 % (ref 36.0–46.0)
Hemoglobin: 14.7 g/dL (ref 12.0–15.0)
LYMPHS ABS: 1.9 10*3/uL (ref 0.7–4.0)
Lymphocytes Relative: 27 % (ref 12–46)
MCH: 32.9 pg (ref 26.0–34.0)
MCHC: 35.5 g/dL (ref 30.0–36.0)
MCV: 92.6 fL (ref 78.0–100.0)
MONO ABS: 0.7 10*3/uL (ref 0.1–1.0)
Monocytes Relative: 11 % (ref 3–12)
Neutro Abs: 4.2 10*3/uL (ref 1.7–7.7)
Neutrophils Relative %: 60 % (ref 43–77)
Platelets: 237 10*3/uL (ref 150–400)
RBC: 4.47 MIL/uL (ref 3.87–5.11)
RDW: 14.2 % (ref 11.5–15.5)
WBC: 6.9 10*3/uL (ref 4.0–10.5)

## 2013-11-06 LAB — BASIC METABOLIC PANEL
BUN: 8 mg/dL (ref 6–23)
CHLORIDE: 98 meq/L (ref 96–112)
CO2: 28 meq/L (ref 19–32)
CREATININE: 0.81 mg/dL (ref 0.50–1.10)
Calcium: 10 mg/dL (ref 8.4–10.5)
GFR calc Af Amer: 85 mL/min — ABNORMAL LOW (ref >90)
GFR calc non Af Amer: 73 mL/min — ABNORMAL LOW (ref >90)
GLUCOSE: 89 mg/dL (ref 70–99)
Potassium: 3.8 mEq/L (ref 3.7–5.3)
Sodium: 137 mEq/L (ref 137–147)

## 2013-11-06 MED ORDER — TETANUS-DIPHTH-ACELL PERTUSSIS 5-2.5-18.5 LF-MCG/0.5 IM SUSP
0.5000 mL | Freq: Once | INTRAMUSCULAR | Status: AC
  Administered 2013-11-06: 0.5 mL via INTRAMUSCULAR
  Filled 2013-11-06: qty 0.5

## 2013-11-06 MED ORDER — FENTANYL CITRATE 0.05 MG/ML IJ SOLN
50.0000 ug | Freq: Once | INTRAMUSCULAR | Status: AC
  Administered 2013-11-06: 50 ug via INTRAVENOUS
  Filled 2013-11-06: qty 2

## 2013-11-06 MED ORDER — SODIUM CHLORIDE 0.9 % IV BOLUS (SEPSIS)
500.0000 mL | Freq: Once | INTRAVENOUS | Status: AC
  Administered 2013-11-06: 500 mL via INTRAVENOUS

## 2013-11-06 NOTE — ED Notes (Signed)
This RN spoke with the pt's niece, who is upstairs with another pt, who states that she will be down shortly.

## 2013-11-06 NOTE — ED Notes (Addendum)
Pt. Ambulated well, no complaints.

## 2013-11-06 NOTE — ED Notes (Signed)
Per [facility], pt from home for a fall last night and hit her head. Told EMS she took 3-4 or possibly 6 oxycodone at 12 with no relief and has a fentanyl patch on. Pt reports that she called her MD and told her she has been having dizzy spells. When pt fell last night she received a cut on the back of her head and placed a bandaid on it. The bandaid is currently sticking to her hair.

## 2013-11-06 NOTE — ED Provider Notes (Signed)
CSN: [id number]     Arrival date & time 11/06/13  1410 History   First MD Initiated Contact with Patient 11/06/13 1558     Chief Complaint  Patient presents with  . Fall  . Laceration     (Consider location/radiation/quality/duration/timing/severity/associated sxs/prior Treatment) HPI Comments: 68 year old female with smoking, arthritis the presents with dizziness and falls.  Over the past month patient has had at least 3 dizzy spells and multiple falls some of which secondarily to dizzy spell. Patient says they come on spontaneously they're not specifically worse with standing or head movement. No history of similar prior to month ago and no known stroke history. Last night patient was standing and suddenly fell back with possibly mild dizziness prior to. Patient hit the back or head no recalls also consciousness. Patient had bleeding and laceration however did not seek medical care. Family brought patient today as she lives alone. Patient does take Xanax for difficulty sleeping however denies any recent change in the meds and is not feel her falls or dizziness occur after taking the meds. Patient is on other significant sedating medicines including trazodone and oxycodone. Pain with palpation of posterior scalp. Patient is known blood thinners. Pain with right hip with range of motion however patient was walking on earlier today. Lower back pain with movement.  Patient is a 68 y.o. female presenting with fall and skin laceration. The history is provided by the patient.  Fall Pertinent negatives include no chest pain, no abdominal pain, no headaches and no shortness of breath.  Laceration   Past Medical History  Diagnosis Date  . Arthritis   . Osteoporosis   . COPD (chronic obstructive pulmonary disease)    History reviewed. No pertinent past surgical history. History reviewed. No pertinent family history. History  Substance Use Topics  . Smoking status: Current Every Day Smoker -- 2.00  packs/day  . Smokeless tobacco: Never Used  . Alcohol Use: No   OB History   Grav Para Term Preterm Abortions TAB SAB Ect Mult Living                 Review of Systems  Constitutional: Positive for appetite change. Negative for fever and chills.  HENT: Negative for congestion.   Eyes: Negative for visual disturbance.  Respiratory: Negative for shortness of breath.   Cardiovascular: Negative for chest pain.  Gastrointestinal: Negative for vomiting and abdominal pain.  Genitourinary: Negative for dysuria and flank pain.  Musculoskeletal: Positive for arthralgias, back pain, gait problem and neck pain. Negative for neck stiffness.  Skin: Negative for rash.  Neurological: Positive for dizziness. Negative for seizures, syncope, light-headedness and headaches.      Allergies  Aspirin; Ciprofloxacin; Contrast media; Erythromycin base; Gadolinium derivatives; Ibuprofen; and Penicillins  Home Medications   Prior to Admission medications   Medication Sig Start Date End Date Taking? Authorizing Provider  albuterol (PROVENTIL HFA;VENTOLIN HFA) 108 (90 BASE) MCG/ACT inhaler Inhale 2 puffs into the lungs every 6 (six) hours as needed for wheezing or shortness of breath.   Yes Historical Provider, MD  alprazolam Duanne Moron) 2 MG tablet Take 6 mg by mouth at bedtime as needed for sleep.    Yes Historical Provider, MD  aspirin EC 81 MG tablet Take 81 mg by mouth daily.   Yes Historical Provider, MD  budesonide (ENTOCORT EC) 3 MG 24 hr capsule Take 3 mg by mouth at bedtime.   Yes Historical Provider, MD  budesonide-formoterol (SYMBICORT) 80-4.5 MCG/ACT inhaler Inhale 2 puffs into  the lungs 2 (two) times daily.   Yes Historical Provider, MD  Calcium Carbonate (CALCIUM 600 PO) Take 600 mg by mouth daily.   Yes Historical Provider, MD  Cholecalciferol (VITAMIN D-3 PO) Take 2,000 mg by mouth daily.    Yes Historical Provider, MD  esomeprazole (NEXIUM) 40 MG capsule Take 40 mg by mouth 2 (two) times  daily.    Yes Historical Provider, MD  fentaNYL (DURAGESIC - DOSED MCG/HR) 50 MCG/HR Place 1 patch onto the skin every other day.   Yes Historical Provider, MD  gabapentin (NEURONTIN) 600 MG tablet Take 1,800-2,400 mg by mouth 3 (three) times daily as needed (pain).    Yes Historical Provider, MD  oxyCODONE (OXYCONTIN) 10 MG 12 hr tablet Take 40-60 mg by mouth every 12 (twelve) hours.   Yes Historical Provider, MD  tiZANidine (ZANAFLEX) 4 MG tablet Take 8-12 mg by mouth every 4 (four) hours as needed (muscle pain).    Yes Historical Provider, MD  traZODone (DESYREL) 50 MG tablet Take 200 mg by mouth at bedtime.    Yes Historical Provider, MD   BP 180/81  Pulse 71  Temp(Src) 98 F (36.7 C) (Oral)  Resp 16  Ht 5' (1.524 m)  Wt 110 lb (49.896 kg)  BMI 21.48 kg/m2  SpO2 97% Physical Exam  Nursing note and vitals reviewed. Constitutional: She is oriented to person, place, and time. She appears well-developed and well-nourished.  HENT:  Head: Normocephalic.  Proximate 3 cm posterior scalp abrasion with moderate hair and dried blood. No step-off appreciated mild tenderness.  Eyes: Conjunctivae are normal. Right eye exhibits no discharge. Left eye exhibits no discharge.  Neck: Normal range of motion. Neck supple. No tracheal deviation present.  Cardiovascular: Normal rate and regular rhythm.   Pulmonary/Chest: Effort normal and breath sounds normal.  Abdominal: Soft. She exhibits no distension. There is no tenderness. There is no guarding.  Musculoskeletal: She exhibits tenderness. She exhibits no edema.  Patient has mild midline cervical tenderness with supple neck and full range of motion. Patient has moderate midline lumbar tenderness with palpation paraspinal and midline. Patient has mild tenderness with internal/external rotation the right hip.  Neurological: She is alert and oriented to person, place, and time. GCS eye subscore is 4. GCS verbal subscore is 5. GCS motor subscore is 6.  5+  strength in UE and LE with f/e at major joints. Sensation to palpation intact in UE and LE. CNs 2-12 grossly intact.  EOMFI.  PERRL.   Finger nose and coordination intact bilateral.   Visual fields intact to finger testing. No nystagmus appreciated. Visual fields intact to fingers.  Skin: Skin is warm. No rash noted.  Psychiatric: She has a normal mood and affect.    ED Course  Procedures (including critical care time) Labs Review Labs Reviewed  BASIC METABOLIC PANEL - Abnormal; Notable for the following:    GFR calc non Af Amer 73 (*)    GFR calc Af Amer 85 (*)    All other components within normal limits  CBC WITH DIFFERENTIAL  URINALYSIS, ROUTINE W REFLEX MICROSCOPIC    Imaging Review Dg Lumbar Spine Complete  11/06/2013   CLINICAL DATA:  [place] last night.  EXAM: LUMBAR SPINE - COMPLETE 4+ VIEW  COMPARISON:  05/23/2013.  FINDINGS: Bones are diffusely demineralized. No evidence for an acute fracture. No subluxation. Diffuse loss of disc height is seen from L3-4 through L5-S1. SI joints are normal.  IMPRESSION: Stable.  Osteopenia with lower lumbar degenerative  disc disease.   Electronically Signed   By: Misty Stanley M.D.   On: 11/06/2013 19:56   Dg Hip Complete Right  11/06/2013   CLINICAL DATA:  Right hip pain.  EXAM: RIGHT HIP - COMPLETE 2+ VIEW  COMPARISON:  Plain films right hip 02/08/2013.  FINDINGS: Both hips are located. There is no fracture. Joint spaces are preserved. No evidence of avascular necrosis is identified. Surrounding osseous and soft tissue structures are unremarkable.  IMPRESSION: Negative exam.   Electronically Signed   By: Inge Rise M.D.   On: 11/06/2013 19:56   Ct Head Wo Contrast  11/06/2013   CLINICAL DATA:  Fall.  Posterior scalp laceration.  EXAM: CT HEAD WITHOUT CONTRAST  CT CERVICAL SPINE WITHOUT CONTRAST  TECHNIQUE: Multidetector CT imaging of the head and cervical spine was performed following the standard protocol without intravenous contrast.  Multiplanar CT image reconstructions of the cervical spine were also generated.  COMPARISON:  05/23/2013  FINDINGS: CT HEAD FINDINGS  Ventricles are normal in size, for this patient's age, and normal in configuration. No parenchymal masses or mass effect. Minor periventricular white matter hypoattenuation is noted consistent with chronic microvascular ischemic change. No evidence of a cortical infarct.  There is a stable coil mass ejecting along the anterior right temporal lobe. No extra-axial masses or abnormal fluid collections.  No intracranial hemorrhage.  There is posterior scalp swelling/ hematoma formation from the scalp laceration. No radiopaque foreign body.  No skull fracture. Visualized sinuses and mastoid air cells are clear.  CT CERVICAL SPINE FINDINGS  No fracture. No spondylolisthesis. There is moderate loss of disc height with endplate sclerosis at J6-E8. This is stable. Mild neural foraminal narrowing on the right at C6-C7. Remaining neural foramina are well preserved. No significant central stenosis.  Surrounding soft tissues are unremarkable. The lung apices are clear.  IMPRESSION: HEAD CT: No acute intracranial abnormality. Posterior scalp laceration/ hematoma. No fracture.  CERVICAL CT:  No fracture or acute finding.   Electronically Signed   By: Lajean Manes M.D.   On: 11/06/2013 18:39   Ct Cervical Spine Wo Contrast  11/06/2013   CLINICAL DATA:  Fall.  Posterior scalp laceration.  EXAM: CT HEAD WITHOUT CONTRAST  CT CERVICAL SPINE WITHOUT CONTRAST  TECHNIQUE: Multidetector CT imaging of the head and cervical spine was performed following the standard protocol without intravenous contrast. Multiplanar CT image reconstructions of the cervical spine were also generated.  COMPARISON:  05/23/2013  FINDINGS: CT HEAD FINDINGS  Ventricles are normal in size, for this patient's age, and normal in configuration. No parenchymal masses or mass effect. Minor periventricular white matter hypoattenuation  is noted consistent with chronic microvascular ischemic change. No evidence of a cortical infarct.  There is a stable coil mass ejecting along the anterior right temporal lobe. No extra-axial masses or abnormal fluid collections.  No intracranial hemorrhage.  There is posterior scalp swelling/ hematoma formation from the scalp laceration. No radiopaque foreign body.  No skull fracture. Visualized sinuses and mastoid air cells are clear.  CT CERVICAL SPINE FINDINGS  No fracture. No spondylolisthesis. There is moderate loss of disc height with endplate sclerosis at B1-D1. This is stable. Mild neural foraminal narrowing on the right at C6-C7. Remaining neural foramina are well preserved. No significant central stenosis.  Surrounding soft tissues are unremarkable. The lung apices are clear.  IMPRESSION: HEAD CT: No acute intracranial abnormality. Posterior scalp laceration/ hematoma. No fracture.  CERVICAL CT:  No fracture or acute finding.  Electronically Signed   By: Lajean Manes M.D.   On: 11/06/2013 18:39   Mr Brain Wo Contrast  11/06/2013   CLINICAL DATA:  68 year old female status post fall. Dizziness. 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 Initial encounter.  EXAM: MRI HEAD WITHOUT CONTRAST  TECHNIQUE: Multiplanar, multiecho pulse sequences of the brain and surrounding structures were obtained without intravenous contrast.  COMPARISON:  Head CT and cervical spine CT 1818 hr today. Brain MRI and MRA 01/16/2013.  FINDINGS: Major intracranial vascular flow voids are stable. Stable cerebral volume. No restricted diffusion or evidence of acute infarction. No intracranial mass effect. No ventriculomegaly. Negative pituitary, cervicomedullary junction and visualized cervical spine. Normal bone marrow signal.  No evidence of acute intracranial hemorrhage. Right MCA trifurcation coil pack with susceptibility artifact re- identified (series 4, image 9, series 8, image 9. Stable gray and  white matter signal throughout the brain.  Grossly normal visualized internal auditory structures. Mastoids are clear. Minor paranasal sinus mucosal thickening. Visualized orbit soft tissues are within normal limits. Visualized scalp soft tissues are within normal limits.  IMPRESSION: No acute intracranial abnormality.  Stable non contrast MRI appearance of the brain since 01/16/2013.  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   Electronically Signed   By: Lars Pinks M.D.   On: 11/06/2013 19:32     EKG Interpretation   Date/Time:  Wednesday November 06 2013 18:04:48 EDT Ventricular Rate:  71 PR Interval:  164 QRS Duration: 87 QT Interval:  372 QTC Calculation: 404 R Axis:   -59 Text Interpretation:  Sinus rhythm Left anterior fascicular block  Confirmed by Kamisha Ell  MD, Kaedan Richert ([id number]) on 11/06/2013 6:37:10 PM      MDM   Final diagnoses:  Vertigo  Falls  Right hip pain  Scalp laceration  Acute head injury   Patient with recent falls secondary to dizziness. Patient does feel is more for room spinning sensation. Differential includes secondary to her multiple sedating medicines versus stroke versus dehydration versus other. Plan for CT head, blood work, x-rays and an MRI of the brain to rule out stroke. Wound care, pain medicines and IV fluids given in the ER. Tetanus shot updated. Blood pressure elevated in ER likely mostly from pain. Followup for recheck with primary Dr. Scalp wound cleaned by myself with no significant laceration.  Medications  Tdap (BOOSTRIX) injection 0.5 mL (0.5 mLs Intramuscular Given 11/06/13 2022)  fentaNYL (SUBLIMAZE) injection 50 mcg (50 mcg Intravenous Given 11/06/13 2003)  sodium chloride 0.9 % bolus 500 mL (0 mLs Intravenous Stopped 11/06/13 2143)   Patient improved in the ER and on recheck normal neurologic exam. CTs x-rays and MRI all reviewed results in no acute findings. Patient ambulated in ED. Discussed very close followup outpatient and  reasons to return. Results and differential diagnosis were discussed with the patient/parent/guardian. Close follow up outpatient was discussed, comfortable with the plan.   Filed Vitals:   11/06/13 1714 11/06/13 2005 11/06/13 2034 11/06/13 2132  BP: 180/81 168/89 157/81 182/79  Pulse:  73 79 71  Temp:    98.1 F (36.7 C)  TempSrc:    Oral  Resp: 16   16  Height:      Weight:      SpO2: 97% 97% 95% 96%      Mariea Clonts, MD 11/08/13 [phone]

## 2013-11-06 NOTE — ED Notes (Addendum)
Denies LOC  Lac to the back of the head,. Will undress to access.

## 2013-11-06 NOTE — ED Notes (Signed)
Attempted to call cousin Katharine Look to transport pt home. Number is [phone]

## 2013-11-06 NOTE — Discharge Instructions (Signed)
Please review medication list with her primary physician and be careful in taking your narcotics and Xanax. Stay well-hydrated and follow closely with your Dr. and have family close by the next couple days in case he needs help. If you were given medicines take as directed.  If you are on coumadin or contraceptives realize their levels and effectiveness is altered by many different medicines.  If you have any reaction (rash, tongues swelling, other) to the medicines stop taking and see a physician.   Please follow up as directed and return to the ER or see a physician for new or worsening symptoms.  Thank you. Filed Vitals:   11/06/13 1623 11/06/13 1630 11/06/13 1714 11/06/13 2005  BP: 173/80 169/100 180/81 168/89  Pulse:  71  73  Temp:      TempSrc:      Resp: 19 13 16    Height:      Weight:      SpO2: 99% 97% 97% 97%

## 2013-11-06 NOTE — ED Notes (Signed)
Patient transported to X-ray 

## 2013-11-14 DIAGNOSIS — M1610 Unilateral primary osteoarthritis, unspecified hip: Secondary | ICD-10-CM | POA: Diagnosis not present

## 2013-11-14 DIAGNOSIS — M169 Osteoarthritis of hip, unspecified: Secondary | ICD-10-CM | POA: Diagnosis not present

## 2013-11-14 DIAGNOSIS — M47817 Spondylosis without myelopathy or radiculopathy, lumbosacral region: Secondary | ICD-10-CM | POA: Diagnosis not present

## 2013-11-14 DIAGNOSIS — IMO0002 Reserved for concepts with insufficient information to code with codable children: Secondary | ICD-10-CM | POA: Diagnosis not present

## 2013-11-19 DIAGNOSIS — R55 Syncope and collapse: Secondary | ICD-10-CM | POA: Diagnosis not present

## 2013-11-19 DIAGNOSIS — R609 Edema, unspecified: Secondary | ICD-10-CM | POA: Diagnosis not present

## 2013-11-19 DIAGNOSIS — Z79899 Other long term (current) drug therapy: Secondary | ICD-10-CM | POA: Diagnosis not present

## 2013-11-19 DIAGNOSIS — F3289 Other specified depressive episodes: Secondary | ICD-10-CM | POA: Diagnosis not present

## 2013-12-12 DIAGNOSIS — M169 Osteoarthritis of hip, unspecified: Secondary | ICD-10-CM | POA: Diagnosis not present

## 2013-12-12 DIAGNOSIS — IMO0002 Reserved for concepts with insufficient information to code with codable children: Secondary | ICD-10-CM | POA: Diagnosis not present

## 2013-12-12 DIAGNOSIS — M47817 Spondylosis without myelopathy or radiculopathy, lumbosacral region: Secondary | ICD-10-CM | POA: Diagnosis not present

## 2013-12-12 DIAGNOSIS — M1610 Unilateral primary osteoarthritis, unspecified hip: Secondary | ICD-10-CM | POA: Diagnosis not present

## 2013-12-16 DIAGNOSIS — N760 Acute vaginitis: Secondary | ICD-10-CM | POA: Diagnosis not present

## 2013-12-26 ENCOUNTER — Other Ambulatory Visit: Payer: Self-pay | Admitting: Dermatology

## 2013-12-26 DIAGNOSIS — C44320 Squamous cell carcinoma of skin of unspecified parts of face: Secondary | ICD-10-CM | POA: Diagnosis not present

## 2014-01-16 ENCOUNTER — Emergency Department (HOSPITAL_COMMUNITY)
Admission: EM | Admit: 2014-01-16 | Discharge: 2014-01-16 | Disposition: A | Discharge status: Home / Self Care | Payer: Medicare Other | Attending: Emergency Medicine | Admitting: Emergency Medicine

## 2014-01-16 ENCOUNTER — Emergency Department (HOSPITAL_COMMUNITY): Payer: Medicare Other

## 2014-01-16 DIAGNOSIS — F17200 Nicotine dependence, unspecified, uncomplicated: Secondary | ICD-10-CM | POA: Diagnosis not present

## 2014-01-16 DIAGNOSIS — M25551 Pain in right hip: Secondary | ICD-10-CM

## 2014-01-16 DIAGNOSIS — J4489 Other specified chronic obstructive pulmonary disease: Secondary | ICD-10-CM | POA: Insufficient documentation

## 2014-01-16 DIAGNOSIS — M169 Osteoarthritis of hip, unspecified: Secondary | ICD-10-CM | POA: Diagnosis not present

## 2014-01-16 DIAGNOSIS — Z79899 Other long term (current) drug therapy: Secondary | ICD-10-CM | POA: Insufficient documentation

## 2014-01-16 DIAGNOSIS — Z880 Allergy status to penicillin: Secondary | ICD-10-CM | POA: Insufficient documentation

## 2014-01-16 DIAGNOSIS — Z7982 Long term (current) use of aspirin: Secondary | ICD-10-CM | POA: Diagnosis not present

## 2014-01-16 DIAGNOSIS — M129 Arthropathy, unspecified: Secondary | ICD-10-CM | POA: Diagnosis not present

## 2014-01-16 DIAGNOSIS — Z8739 Personal history of other diseases of the musculoskeletal system and connective tissue: Secondary | ICD-10-CM | POA: Diagnosis not present

## 2014-01-16 DIAGNOSIS — M47817 Spondylosis without myelopathy or radiculopathy, lumbosacral region: Secondary | ICD-10-CM | POA: Diagnosis not present

## 2014-01-16 DIAGNOSIS — G8929 Other chronic pain: Secondary | ICD-10-CM | POA: Diagnosis not present

## 2014-01-16 DIAGNOSIS — R6889 Other general symptoms and signs: Secondary | ICD-10-CM | POA: Diagnosis not present

## 2014-01-16 DIAGNOSIS — M25559 Pain in unspecified hip: Secondary | ICD-10-CM | POA: Insufficient documentation

## 2014-01-16 DIAGNOSIS — IMO0002 Reserved for concepts with insufficient information to code with codable children: Secondary | ICD-10-CM | POA: Diagnosis not present

## 2014-01-16 DIAGNOSIS — J449 Chronic obstructive pulmonary disease, unspecified: Secondary | ICD-10-CM | POA: Insufficient documentation

## 2014-01-16 DIAGNOSIS — M1610 Unilateral primary osteoarthritis, unspecified hip: Secondary | ICD-10-CM | POA: Diagnosis not present

## 2014-01-16 MED ORDER — OXYCODONE-ACETAMINOPHEN 5-325 MG PO TABS
2.0000 | ORAL_TABLET | Freq: Once | ORAL | Status: AC
  Administered 2014-01-16: 2 via ORAL
  Filled 2014-01-16: qty 2

## 2014-01-16 NOTE — ED Notes (Signed)
Pt BIB EMS. Pt c/o R hip pain since Sat. Pt fell one month ago and injured her L hip. Pt denies injury to R hip. Pt reports that she has chronic hip and back pain. Pt told EMS that drove herself to the pain clinic today and got her oxycodone refilled. Pt also told EMS that the oxycodone doesn't help her pain. Pt arrives wearing 2 Fentanyl patches. Pt lives at home alone per EMS.

## 2014-01-16 NOTE — ED Notes (Signed)
Bed: [facility]ALA Expected date:  Expected time:  Means of arrival:  Comments: 

## 2014-01-16 NOTE — ED Notes (Signed)
Bed: WTR5 Expected date:  Expected time:  Means of arrival:  Comments: EMS, hip pain

## 2014-01-16 NOTE — ED Notes (Signed)
Patient is alert and oriented x3.  She was given DC instructions and follow up visit instructions.  Patient gave verbal understanding. She was DC ambulatory under her own power to home.  V/S not taken due to patient unwilling to stay in room and wanting to be DC.  He was not showing any signs of distress on DC

## 2014-01-16 NOTE — ED Provider Notes (Signed)
CSN: [id number]     Arrival date & time 01/16/14  1927 History   First MD Initiated Contact with Patient 01/16/14 2003     Chief Complaint  Patient presents with  . Chronic Hip Pain      Patient is a 68 y.o. female presenting with hip pain. The history is provided by the patient.  Hip Pain This is a recurrent problem. The current episode started more than 1 week ago. The problem occurs daily. The problem has been gradually worsening. Pertinent negatives include no chest pain, no abdominal pain and no shortness of breath. Exacerbated by: position. Nothing relieves the symptoms. She has tried rest for the symptoms. The treatment provided no relief.  pt reports increasing right hip pain for over a week No new falls/injury She reports pain is so severe she can not walk She reports back pain after sitting in the waiting room She reports her pain specialist told her to come to the ER  Past Medical History  Diagnosis Date  . Arthritis   . Osteoporosis   . COPD (chronic obstructive pulmonary disease)    No past surgical history on file. No family history on file. History  Substance Use Topics  . Smoking status: Current Every Day Smoker -- 2.00 packs/day  . Smokeless tobacco: Never Used  . Alcohol Use: No   OB History   Grav Para Term Preterm Abortions TAB SAB Ect Mult Living                 Review of Systems  Constitutional: Negative for fever.  Respiratory: Negative for shortness of breath.   Cardiovascular: Negative for chest pain.  Gastrointestinal: Negative for abdominal pain.  Musculoskeletal: Positive for arthralgias and back pain.  Psychiatric/Behavioral: The patient is nervous/anxious.   All other systems reviewed and are negative.     Allergies  Aspirin; Ciprofloxacin; Contrast media; Erythromycin base; Gadolinium derivatives; Ibuprofen; and Penicillins  Home Medications   Prior to Admission medications   Medication Sig Start Date End Date Taking? Authorizing  Provider  albuterol (PROVENTIL HFA;VENTOLIN HFA) 108 (90 BASE) MCG/ACT inhaler Inhale 2 puffs into the lungs every 6 (six) hours as needed for wheezing or shortness of breath.    Historical Provider, MD  alprazolam Duanne Moron) 2 MG tablet Take 6 mg by mouth at bedtime as needed for sleep.     Historical Provider, MD  aspirin EC 81 MG tablet Take 81 mg by mouth daily.    Historical Provider, MD  budesonide (ENTOCORT EC) 3 MG 24 hr capsule Take 3 mg by mouth at bedtime.    Historical Provider, MD  budesonide-formoterol (SYMBICORT) 80-4.5 MCG/ACT inhaler Inhale 2 puffs into the lungs 2 (two) times daily.    Historical Provider, MD  Calcium Carbonate (CALCIUM 600 PO) Take 600 mg by mouth daily.    Historical Provider, MD  Cholecalciferol (VITAMIN D-3 PO) Take 2,000 mg by mouth daily.     Historical Provider, MD  esomeprazole (NEXIUM) 40 MG capsule Take 40 mg by mouth 2 (two) times daily.     Historical Provider, MD  fentaNYL (DURAGESIC - DOSED MCG/HR) 50 MCG/HR Place 1 patch onto the skin every other day.    Historical Provider, MD  gabapentin (NEURONTIN) 600 MG tablet Take 1,800-2,400 mg by mouth 3 (three) times daily as needed (pain).     Historical Provider, MD  oxyCODONE (OXYCONTIN) 10 MG 12 hr tablet Take 40-60 mg by mouth every 12 (twelve) hours.    Historical Provider, MD  tiZANidine (ZANAFLEX) 4 MG tablet Take 8-12 mg by mouth every 4 (four) hours as needed (muscle pain).     Historical Provider, MD  traZODone (DESYREL) 50 MG tablet Take 200 mg by mouth at bedtime.     Historical Provider, MD   BP 154/106  Pulse 78  Temp(Src) 98.4 F (36.9 C) (Oral)  Resp 16  SpO2 94% Physical Exam CONSTITUTIONAL: elderly, tearful HEAD: Normocephalic/atraumatic EYES: EOMI/PERRL ENMT: Mucous membranes moist NECK: supple no meningeal signs SPINE:entire spine nontender CV: S1/S2 noted, no murmurs/rubs/gallops noted LUNGS: Lungs are clear to auscultation bilaterally, no apparent distress ABDOMEN: soft,  nontender, no rebound or guarding GU:no cva tenderness NEURO: Pt is awake/alert, moves all extremitiesx4 EXTREMITIES: pulses normal, full ROM, tenderness to palpation and range of motion to right hip.  No erythema/bruising or warmth noted to hip SKIN: warm, color normal PSYCH: no abnormalities of mood noted  ED Course  Procedures  Pt reports h/o chronic back pain and worsening right hip pain over past week No new trauma She appeared in pain and imaging ordered Prior to workup being complete, she was demanding discharge home She was walking around the ED in no distress  MDM   Final diagnoses:  Chronic hip pain, right    Nursing notes including past medical history and social history reviewed and considered in documentation Narcotic database reviewed and considered in decision making Previous records reviewed and considered     Sharyon Cable, MD 01/16/14 2333

## 2014-01-16 NOTE — Discharge Instructions (Signed)

## 2014-01-28 DIAGNOSIS — M25559 Pain in unspecified hip: Secondary | ICD-10-CM | POA: Diagnosis not present

## 2014-01-30 ENCOUNTER — Encounter (HOSPITAL_COMMUNITY): Payer: Self-pay | Admitting: Emergency Medicine

## 2014-01-30 ENCOUNTER — Emergency Department (HOSPITAL_COMMUNITY)
Admission: EM | Admit: 2014-01-30 | Discharge: 2014-01-30 | Disposition: A | Discharge status: Home / Self Care | Payer: Medicare Other | Attending: Emergency Medicine | Admitting: Emergency Medicine

## 2014-01-30 ENCOUNTER — Emergency Department (HOSPITAL_COMMUNITY): Payer: Medicare Other

## 2014-01-30 DIAGNOSIS — F411 Generalized anxiety disorder: Secondary | ICD-10-CM | POA: Insufficient documentation

## 2014-01-30 DIAGNOSIS — F419 Anxiety disorder, unspecified: Secondary | ICD-10-CM

## 2014-01-30 DIAGNOSIS — M810 Age-related osteoporosis without current pathological fracture: Secondary | ICD-10-CM | POA: Diagnosis not present

## 2014-01-30 DIAGNOSIS — Z79899 Other long term (current) drug therapy: Secondary | ICD-10-CM | POA: Diagnosis not present

## 2014-01-30 DIAGNOSIS — G8929 Other chronic pain: Secondary | ICD-10-CM

## 2014-01-30 DIAGNOSIS — J441 Chronic obstructive pulmonary disease with (acute) exacerbation: Secondary | ICD-10-CM | POA: Diagnosis not present

## 2014-01-30 DIAGNOSIS — F329 Major depressive disorder, single episode, unspecified: Secondary | ICD-10-CM | POA: Insufficient documentation

## 2014-01-30 DIAGNOSIS — Z7982 Long term (current) use of aspirin: Secondary | ICD-10-CM | POA: Insufficient documentation

## 2014-01-30 DIAGNOSIS — F3289 Other specified depressive episodes: Secondary | ICD-10-CM | POA: Insufficient documentation

## 2014-01-30 DIAGNOSIS — M129 Arthropathy, unspecified: Secondary | ICD-10-CM | POA: Insufficient documentation

## 2014-01-30 DIAGNOSIS — Z880 Allergy status to penicillin: Secondary | ICD-10-CM | POA: Insufficient documentation

## 2014-01-30 DIAGNOSIS — R079 Chest pain, unspecified: Secondary | ICD-10-CM | POA: Diagnosis not present

## 2014-01-30 DIAGNOSIS — J438 Other emphysema: Secondary | ICD-10-CM | POA: Diagnosis not present

## 2014-01-30 DIAGNOSIS — F17200 Nicotine dependence, unspecified, uncomplicated: Secondary | ICD-10-CM | POA: Insufficient documentation

## 2014-01-30 LAB — CBC WITH DIFFERENTIAL/PLATELET
BASOS ABS: 0 10*3/uL (ref 0.0–0.1)
Basophils Relative: 0 % (ref 0–1)
Eosinophils Absolute: 0 10*3/uL (ref 0.0–0.7)
Eosinophils Relative: 0 % (ref 0–5)
HEMATOCRIT: 43.2 % (ref 36.0–46.0)
Hemoglobin: 14.6 g/dL (ref 12.0–15.0)
Lymphocytes Relative: 19 % (ref 12–46)
Lymphs Abs: 1.5 10*3/uL (ref 0.7–4.0)
MCH: 31.6 pg (ref 26.0–34.0)
MCHC: 33.8 g/dL (ref 30.0–36.0)
MCV: 93.5 fL (ref 78.0–100.0)
Monocytes Absolute: 0.4 10*3/uL (ref 0.1–1.0)
Monocytes Relative: 5 % (ref 3–12)
NEUTROS ABS: 6 10*3/uL (ref 1.7–7.7)
Neutrophils Relative %: 76 % (ref 43–77)
Platelets: 278 10*3/uL (ref 150–400)
RBC: 4.62 MIL/uL (ref 3.87–5.11)
RDW: 13.7 % (ref 11.5–15.5)
WBC: 7.9 10*3/uL (ref 4.0–10.5)

## 2014-01-30 LAB — BASIC METABOLIC PANEL
ANION GAP: 17 — AB (ref 5–15)
BUN: 10 mg/dL (ref 6–23)
CO2: 22 meq/L (ref 19–32)
Calcium: 9.5 mg/dL (ref 8.4–10.5)
Chloride: 99 mEq/L (ref 96–112)
Creatinine, Ser: 0.72 mg/dL (ref 0.50–1.10)
GFR calc Af Amer: >90 mL/min (ref >90)
GFR calc non Af Amer: 86 mL/min — ABNORMAL LOW (ref >90)
Glucose, Bld: 106 mg/dL — ABNORMAL HIGH (ref 70–99)
Potassium: 4 mEq/L (ref 3.7–5.3)
SODIUM: 138 meq/L (ref 137–147)

## 2014-01-30 LAB — TROPONIN I

## 2014-01-30 LAB — PRO B NATRIURETIC PEPTIDE: PRO B NATRI PEPTIDE: 219.3 pg/mL — AB (ref 0–125)

## 2014-01-30 NOTE — Discharge Instructions (Signed)
Chronic Pain Chronic pain can be defined as pain that is off and on and lasts for 3-6 months or longer. Many things cause chronic pain, which can make it difficult to make a diagnosis. There are many treatment options available for chronic pain. However, finding a treatment that works well for you may require trying various approaches until the right one is found. Many people benefit from a combination of two or more types of treatment to control their pain. SYMPTOMS  Chronic pain can occur anywhere in the body and can range from mild to very severe. Some types of chronic pain include:  Headache.  Low back pain.  Cancer pain.  Arthritis pain.  Neurogenic pain. This is pain resulting from damage to nerves. People with chronic pain may also have other symptoms such as:  Depression.  Anger.  Insomnia.  Anxiety. DIAGNOSIS  Your health care provider will help diagnose your condition over time. In many cases, the initial focus will be on excluding possible conditions that could be causing the pain. Depending on your symptoms, your health care provider may order tests to diagnose your condition. Some of these tests may include:   Blood tests.   CT scan.   MRI.   X-rays.   Ultrasounds.   Nerve conduction studies.  You may need to see a specialist.  TREATMENT  Finding treatment that works well may take time. You may be referred to a pain specialist. He or she may prescribe medicine or therapies, such as:   Mindful meditation or yoga.  Shots (injections) of numbing or pain-relieving medicines into the spine or area of pain.  Local electrical stimulation.  Acupuncture.   Massage therapy.   Aroma, color, light, or sound therapy.   Biofeedback.   Working with a physical therapist to keep from getting stiff.   Regular, gentle exercise.   Cognitive or behavioral therapy.   Group support.  Sometimes, surgery may be recommended.  HOME CARE INSTRUCTIONS    Take all medicines as directed by your health care provider.   Lessen stress in your life by relaxing and doing things such as listening to calming music.   Exercise or be active as directed by your health care provider.   Eat a healthy diet and include things such as vegetables, fruits, fish, and lean meats in your diet.   Keep all follow-up appointments with your health care provider.   Attend a support group with others suffering from chronic pain. SEEK MEDICAL CARE IF:   Your pain gets worse.   You develop a new pain that was not there before.   You cannot tolerate medicines given to you by your health care provider.   You have new symptoms since your last visit with your health care provider.  SEEK IMMEDIATE MEDICAL CARE IF:   You feel weak.   You have decreased sensation or numbness.   You lose control of bowel or bladder function.   Your pain suddenly gets much worse.   You develop shaking.  You develop chills.  You develop confusion.  You develop chest pain.  You develop shortness of breath.  MAKE SURE YOU:  Understand these instructions.  Will watch your condition.  Will get help right away if you are not doing well or get worse. Document Released: 02/12/2002 Document Revised: 01/23/2013 Document Reviewed: 11/16/2012 [facility] Patient Information 2015 [place], [place]. This information is not intended to replace advice given to you by your health care provider. Make sure you discuss any  questions you have with your health care provider. Generalized Anxiety Disorder Generalized anxiety disorder (GAD) is a mental disorder. It interferes with life functions, including relationships, work, and school. GAD is different from normal anxiety, which everyone experiences at some point in their lives in response to specific life events and activities. Normal anxiety actually helps [place] prepare for and get through these life events and activities. Normal  anxiety goes away after the event or activity is over.  GAD causes anxiety that is not necessarily related to specific events or activities. It also causes excess anxiety in proportion to specific events or activities. The anxiety associated with GAD is also difficult to control. GAD can vary from mild to severe. People with severe GAD can have intense waves of anxiety with physical symptoms (panic attacks).  SYMPTOMS The anxiety and worry associated with GAD are difficult to control. This anxiety and worry are related to many life events and activities and also occur more days than not for 6 months or longer. People with GAD also have three or more of the following symptoms (one or more in children):  Restlessness.   Fatigue.  Difficulty concentrating.   Irritability.  Muscle tension.  Difficulty sleeping or unsatisfying sleep. DIAGNOSIS GAD is diagnosed through an assessment by your health care provider. Your health care provider will ask you questions aboutyour mood,physical symptoms, and events in your life. Your health care provider may ask you about your medical history and use of alcohol or drugs, including prescription medicines. Your health care provider may also do a physical exam and blood tests. Certain medical conditions and the use of certain substances can cause symptoms similar to those associated with GAD. Your health care provider may refer you to a mental health specialist for further evaluation. TREATMENT The following therapies are usually used to treat GAD:   Medication. Antidepressant medication usually is prescribed for [place]-term daily control. Antianxiety medicines may be added in severe cases, especially when panic attacks occur.   Talk therapy (psychotherapy). Certain types of talk therapy can be helpful in treating GAD by providing support, education, and guidance. A form of talk therapy called cognitive behavioral therapy can teach you healthy ways to think  about and react to daily life events and activities.  Stress managementtechniques. These include yoga, meditation, and exercise and can be very helpful when they are practiced regularly. A mental health specialist can help determine which treatment is best for you. Some people see improvement with one therapy. However, other people require a combination of therapies. Document Released: 09/17/2012 Document Revised: 10/07/2013 Document Reviewed: 09/17/2012 [facility] Patient Information 2015 [place], [place]. This information is not intended to replace advice given to you by your health care provider. Make sure you discuss any questions you have with your health care provider.

## 2014-01-30 NOTE — ED Provider Notes (Signed)
CSN: [id number]     Arrival date & time 01/30/14  1751 History   First MD Initiated Contact with Patient 01/30/14 1807     Chief Complaint  Patient presents with  . Chest Pain     (Consider location/radiation/quality/duration/timing/severity/associated sxs/prior Treatment) HPI Comments: Patient presents to ER for evaluation of chest pain. Patient reports that she has been experiencing increased stress recently. She is experiencing pain in her back and in her chest. Both of these complaints are chronic for her. She says that she has had more anxiety than usual.  Patient is a 68 y.o. female presenting with chest pain.  Chest Pain Associated symptoms: shortness of breath     Past Medical History  Diagnosis Date  . Arthritis   . Osteoporosis   . COPD (chronic obstructive pulmonary disease)    History reviewed. No pertinent past surgical history. History reviewed. No pertinent family history. History  Substance Use Topics  . Smoking status: Current Every Day Smoker -- 2.00 packs/day  . Smokeless tobacco: Never Used  . Alcohol Use: No   OB History   Grav Para Term Preterm Abortions TAB SAB Ect Mult Living                 Review of Systems  Respiratory: Positive for shortness of breath.   Cardiovascular: Positive for chest pain.  Psychiatric/Behavioral: The patient is nervous/anxious.   All other systems reviewed and are negative.     Allergies  Aspirin; Ciprofloxacin; Codeine; Contrast media; Erythromycin base; Gadolinium derivatives; Ibuprofen; and Penicillins  Home Medications   Prior to Admission medications   Medication Sig Start Date End Date Taking? Authorizing Provider  alprazolam Duanne Moron) 2 MG tablet Take 2 mg by mouth 3 (three) times daily.    Yes Historical Provider, MD  buPROPion (WELLBUTRIN SR) 150 MG 12 hr tablet Take 150 mg by mouth daily.   Yes Historical Provider, MD  esomeprazole (NEXIUM) 40 MG capsule Take 40 mg by mouth 2 (two) times daily.    Yes  Historical Provider, MD  fentaNYL (DURAGESIC - DOSED MCG/HR) 50 MCG/HR Place 1 patch onto the skin every other day.   Yes Historical Provider, MD  gabapentin (NEURONTIN) 600 MG tablet Take 1,800 mg by mouth 2 (two) times daily.    Yes Historical Provider, MD  nitroGLYCERIN (NITROSTAT) 0.4 MG SL tablet Place 0.4 mg under the tongue every 5 (five) minutes as needed for chest pain.   Yes Historical Provider, MD  Oxycodone HCl 10 MG TABS Take 10 mg by mouth See admin instructions. Take 1 tablet (10 mg) by mouth 8-12 times daily   Yes Historical Provider, MD  tiZANidine (ZANAFLEX) 4 MG tablet Take 12-24 mg by mouth daily. 3-6 tablets (12-24 mg) daily as tolerated   Yes Historical Provider, MD  traZODone (DESYREL) 50 MG tablet Take 200 mg by mouth at bedtime.    Yes Historical Provider, MD  albuterol (PROVENTIL HFA;VENTOLIN HFA) 108 (90 BASE) MCG/ACT inhaler Inhale 2 puffs into the lungs every 6 (six) hours as needed for wheezing or shortness of breath.    Historical Provider, MD  aspirin EC 81 MG tablet Take 81 mg by mouth daily.    Historical Provider, MD  budesonide (ENTOCORT EC) 3 MG 24 hr capsule Take 3 mg by mouth at bedtime.    Historical Provider, MD  budesonide-formoterol (SYMBICORT) 80-4.5 MCG/ACT inhaler Inhale 2 puffs into the lungs 2 (two) times daily.    Historical Provider, MD  Calcium Carbonate (CALCIUM 600  PO) Take 600 mg by mouth daily.    Historical Provider, MD  Cholecalciferol (VITAMIN D-3 PO) Take 2,000 mg by mouth daily.     Historical Provider, MD   BP 160/83  Pulse 78  Temp(Src) 98.1 F (36.7 C) (Oral)  Resp 15  SpO2 96% Physical Exam  Constitutional: She is oriented to person, place, and time. She appears well-developed and well-nourished. No distress.  HENT:  Head: Normocephalic and atraumatic.  Right Ear: Hearing normal.  Left Ear: Hearing normal.  Nose: Nose normal.  Mouth/Throat: Oropharynx is clear and moist and mucous membranes are normal.  Eyes: Conjunctivae and  EOM are normal. Pupils are equal, round, and reactive to light.  Neck: Normal range of motion. Neck supple.  Cardiovascular: Regular rhythm, S1 normal and S2 normal.  Exam reveals no gallop and no friction rub.   No murmur heard. Pulmonary/Chest: Effort normal and breath sounds normal. No respiratory distress. She exhibits no tenderness.  Abdominal: Soft. Normal appearance and bowel sounds are normal. There is no hepatosplenomegaly. There is no tenderness. There is no rebound, no guarding, no tenderness at McBurney's point and negative Murphy's sign. No hernia.  Musculoskeletal: Normal range of motion.  Neurological: She is alert and oriented to person, place, and time. She has normal strength. No cranial nerve deficit or sensory deficit. Coordination normal. GCS eye subscore is 4. GCS verbal subscore is 5. GCS motor subscore is 6.  Skin: Skin is warm, dry and intact. No rash noted. No cyanosis.  Psychiatric: Her speech is normal and behavior is normal. Thought content normal. Her mood appears anxious. She exhibits a depressed mood. She expresses no homicidal and no suicidal ideation.    ED Course  Procedures (including critical care time) Labs Review Labs Reviewed  BASIC METABOLIC PANEL - Abnormal; Notable for the following:    Glucose, Bld 106 (*)    GFR calc non Af Amer 86 (*)    Anion gap 17 (*)    All other components within normal limits  PRO B NATRIURETIC PEPTIDE - Abnormal; Notable for the following:    Pro B Natriuretic peptide (BNP) 219.3 (*)    All other components within normal limits  CBC WITH DIFFERENTIAL  TROPONIN I    Imaging Review Dg Chest 2 View  01/30/2014   CLINICAL DATA:  Short of breath. Squeezing sensations in the LEFT chest. RIGHT shoulder pain.  EXAM: CHEST  2 VIEW  COMPARISON:  05/23/2013 and priors.  FINDINGS: Hyperinflation is present compatible with emphysema. Cardiopericardial silhouette and mediastinal contours within normal limits. No airspace disease.  No pleural effusion. Chronic upper to mid thoracic compression fracture. Partially radiopaque monitoring buttons project over the chest.  IMPRESSION: Hyperinflation compatible with emphysema. No acute cardiopulmonary disease.   Electronically Signed   By: Dereck Ligas M.D.   On: 01/30/2014 20:09     EKG Interpretation   Date/Time:  Thursday January 30 2014 17:56:27 EDT Ventricular Rate:  84 PR Interval:  144 QRS Duration: 103 QT Interval:  413 QTC Calculation: 488 R Axis:   -91 Text Interpretation:  Age not entered, assumed to be  68 years old for  purpose of ECG interpretation Sinus rhythm Ventricular premature complex  Consider right atrial enlargement LAD, consider left anterior fascicular  block No significant change since Compared to previous tracing Confirmed  by POLLINA  MD, CHRISTOPHER [id number]) on 01/30/2014 6:11:13 PM      MDM   Final diagnoses:  None   chest pain  Chronic  pain  Anxiety  Patient presents to the ER for evaluation of multiple complaints. Patient has a history of chronic pain syndrome, is complaining of back pain which is typical for her pain. She is also complaining of chest pain, but has chronic chest pain and this is unchanged as well. The patient states that she is under more stress than usual and is feeling very anxious. She has a history of chronic anxiety, takes large doses of Xanax at home. Patient's examination reveals significant anxiety, otherwise no significant abnormality. EKG unchanged from previous. Troponin negative.  Workup is reassuring. Symptoms are likely secondary to her chronic pain and chronic anxiety state. Patient is to be discharged from the emergency department so she can go home and take her medications. Declines admission, will therefore discharge, follow up with PCP. Return if symptoms worsen.   Orpah Greek, MD 01/30/14 2014

## 2014-01-30 NOTE — ED Notes (Addendum)
EMS reports CP that has been going on for a year now. Pt endorses anxiety hx and increased stress lately. No nausea, vomiting shortness of breath, radiating pain. Given 2 nitro and 324 of ASA by EMS

## 2014-02-05 DIAGNOSIS — L723 Sebaceous cyst: Secondary | ICD-10-CM | POA: Diagnosis not present

## 2014-02-05 DIAGNOSIS — L570 Actinic keratosis: Secondary | ICD-10-CM | POA: Diagnosis not present

## 2014-02-05 DIAGNOSIS — Z85828 Personal history of other malignant neoplasm of skin: Secondary | ICD-10-CM | POA: Diagnosis not present

## 2014-02-07 DIAGNOSIS — M25559 Pain in unspecified hip: Secondary | ICD-10-CM | POA: Diagnosis not present

## 2014-02-13 DIAGNOSIS — M169 Osteoarthritis of hip, unspecified: Secondary | ICD-10-CM | POA: Diagnosis not present

## 2014-02-13 DIAGNOSIS — M1610 Unilateral primary osteoarthritis, unspecified hip: Secondary | ICD-10-CM | POA: Diagnosis not present

## 2014-02-13 DIAGNOSIS — IMO0002 Reserved for concepts with insufficient information to code with codable children: Secondary | ICD-10-CM | POA: Diagnosis not present

## 2014-02-13 DIAGNOSIS — M47817 Spondylosis without myelopathy or radiculopathy, lumbosacral region: Secondary | ICD-10-CM | POA: Diagnosis not present

## 2014-02-13 DIAGNOSIS — M1990 Unspecified osteoarthritis, unspecified site: Secondary | ICD-10-CM | POA: Diagnosis not present

## 2014-02-13 DIAGNOSIS — F17200 Nicotine dependence, unspecified, uncomplicated: Secondary | ICD-10-CM | POA: Diagnosis not present

## 2014-02-13 DIAGNOSIS — M810 Age-related osteoporosis without current pathological fracture: Secondary | ICD-10-CM | POA: Diagnosis not present

## 2014-02-19 DIAGNOSIS — T148XXA Other injury of unspecified body region, initial encounter: Secondary | ICD-10-CM | POA: Diagnosis not present

## 2014-02-24 DIAGNOSIS — R112 Nausea with vomiting, unspecified: Secondary | ICD-10-CM | POA: Diagnosis not present

## 2014-02-24 DIAGNOSIS — R1084 Generalized abdominal pain: Secondary | ICD-10-CM | POA: Diagnosis not present

## 2014-02-24 DIAGNOSIS — R197 Diarrhea, unspecified: Secondary | ICD-10-CM | POA: Diagnosis not present

## 2014-02-24 DIAGNOSIS — K625 Hemorrhage of anus and rectum: Secondary | ICD-10-CM | POA: Diagnosis not present

## 2014-03-05 DIAGNOSIS — L821 Other seborrheic keratosis: Secondary | ICD-10-CM | POA: Diagnosis not present

## 2014-03-05 DIAGNOSIS — L98499 Non-pressure chronic ulcer of skin of other sites with unspecified severity: Secondary | ICD-10-CM | POA: Diagnosis not present

## 2014-03-05 DIAGNOSIS — T1490XA Injury, unspecified, initial encounter: Secondary | ICD-10-CM | POA: Diagnosis not present

## 2014-03-06 DIAGNOSIS — R9431 Abnormal electrocardiogram [ECG] [EKG]: Secondary | ICD-10-CM | POA: Diagnosis not present

## 2014-03-06 DIAGNOSIS — F17210 Nicotine dependence, cigarettes, uncomplicated: Secondary | ICD-10-CM | POA: Diagnosis not present

## 2014-03-06 DIAGNOSIS — R079 Chest pain, unspecified: Secondary | ICD-10-CM | POA: Diagnosis not present

## 2014-03-07 ENCOUNTER — Telehealth: Payer: Self-pay | Admitting: Internal Medicine

## 2014-03-07 DIAGNOSIS — R0789 Other chest pain: Secondary | ICD-10-CM | POA: Diagnosis not present

## 2014-03-07 NOTE — Telephone Encounter (Signed)
Pt calls and reports she is a patient of Dr. 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  Feels over the last 2-3 weeks 3 mg budesonide daily has been causing nausea and vomiting.  Occurring after taking the dose No fevers. No diarrhea, no rectal bleeding She spoke to pharmacy and wonders if she can switch to sulfasalazine  I let her know that treatment of this chronic condition needs to be left to her primary GI MD In the interim I advised she stop budesonide all together and use imodium per box instruction over the weekend to control diarrhea should it occur If she has further trouble she can seek can in the ED, urgent care and call back the on call MD She thanked me for the call  Will fax and forward to Dr. Benson Norway

## 2014-03-13 DIAGNOSIS — M47817 Spondylosis without myelopathy or radiculopathy, lumbosacral region: Secondary | ICD-10-CM | POA: Diagnosis not present

## 2014-03-13 DIAGNOSIS — M5416 Radiculopathy, lumbar region: Secondary | ICD-10-CM | POA: Diagnosis not present

## 2014-03-13 DIAGNOSIS — M169 Osteoarthritis of hip, unspecified: Secondary | ICD-10-CM | POA: Diagnosis not present

## 2014-03-20 DIAGNOSIS — K5900 Constipation, unspecified: Secondary | ICD-10-CM | POA: Diagnosis not present

## 2014-03-20 DIAGNOSIS — F413 Other mixed anxiety disorders: Secondary | ICD-10-CM | POA: Diagnosis not present

## 2014-03-31 DIAGNOSIS — R079 Chest pain, unspecified: Secondary | ICD-10-CM | POA: Diagnosis not present

## 2014-03-31 DIAGNOSIS — R1084 Generalized abdominal pain: Secondary | ICD-10-CM | POA: Diagnosis not present

## 2014-03-31 DIAGNOSIS — K529 Noninfective gastroenteritis and colitis, unspecified: Secondary | ICD-10-CM | POA: Diagnosis not present

## 2014-04-07 ENCOUNTER — Ambulatory Visit (INDEPENDENT_AMBULATORY_CARE_PROVIDER_SITE_OTHER): Payer: Medicare Other | Admitting: Cardiology

## 2014-04-07 ENCOUNTER — Encounter: Payer: Self-pay | Admitting: Cardiology

## 2014-04-07 VITALS — BP 134/90 | HR 76 | Ht 60.0 in | Wt 110.0 lb

## 2014-04-07 DIAGNOSIS — R079 Chest pain, unspecified: Secondary | ICD-10-CM

## 2014-04-07 DIAGNOSIS — J449 Chronic obstructive pulmonary disease, unspecified: Secondary | ICD-10-CM

## 2014-04-07 DIAGNOSIS — J4489 Other specified chronic obstructive pulmonary disease: Secondary | ICD-10-CM | POA: Insufficient documentation

## 2014-04-07 DIAGNOSIS — F419 Anxiety disorder, unspecified: Secondary | ICD-10-CM | POA: Diagnosis not present

## 2014-04-07 MED ORDER — NITROGLYCERIN 0.4 MG SL SUBL: 0.4000 mg | SUBLINGUAL_TABLET | SUBLINGUAL | Status: AC | PRN

## 2014-04-07 NOTE — Patient Instructions (Signed)
Your physician recommends that you schedule a follow-up appointment in: [place] Your physician recommends that you continue on your current medications as directed. Please refer to the Current Medication list given to you today. Your physician has requested that you have a lexiscan myoview. For further information please visit [email or web address]. Please follow instruction sheet, as given.

## 2014-04-07 NOTE — Progress Notes (Signed)
Patient ID: [name] [name], female   DOB: 1945/12/16, 68 y.o.   MRN: [id number]    Patient Name: [name] [name] Date of Encounter: 04/07/2014  Primary Care Provider:  Salena Saner., MD Primary Cardiologist:  Dorothy Spark  Problem List   Past Medical History  Diagnosis Date  . Arthritis   . Osteoporosis   . COPD (chronic obstructive pulmonary disease)    No past surgical history on file.  Allergies  Allergies  Allergen Reactions  . Aspirin Other (See Comments)    Beginnings of ulcer  . Ciprofloxacin Other (See Comments)    Possible hand tremors  . Codeine Nausea Only  . Contrast Media [Iodinated Diagnostic Agents] Other (See Comments)    dizziness  . Erythromycin Base Other (See Comments)    Unknown allergic reaction  . Gadolinium Derivatives Other (See Comments)    dizziness  . Ibuprofen Other (See Comments)    Upsets stomach  . Penicillins Swelling    Diagnosed with allergy as a child    HPI  A very pleasant 68 year old female with past medical history of COPD, hypertension, and family history of premature coronary artery disease ( Brother had a myocardial infarction in his 35s) with concern of retrosternal chest pain. The patient states that she has been trying to quit smoking and has been experiencing episodes of anxiety associated with retrosternal chest pains that radiates to her left arm. She is coming also for a preop evaluation for hip replacement. The patient states that for the last month she has been experiencing these pains that are also associated with shortness of breath. She has been trying nitroglycerin pills did give her some relief. She is using Xanax twice a day for relief of her anxiety. She is still trying to quit smoking. She denies any palpitations or syncope. No orthopnea or paroxysmal nocturnal dyspnea.  Home Medications  Prior to Admission medications   Medication Sig Start Date End Date Taking? Authorizing Provider  albuterol (PROVENTIL  HFA;VENTOLIN HFA) 108 (90 BASE) MCG/ACT inhaler Inhale 2 puffs into the lungs every 6 (six) hours as needed for wheezing or shortness of breath.    Historical Provider, MD  alprazolam Duanne Moron) 2 MG tablet Take 2 mg by mouth 3 (three) times daily.     Historical Provider, MD  aspirin EC 81 MG tablet Take 81 mg by mouth daily.    Historical Provider, MD  budesonide (ENTOCORT EC) 3 MG 24 hr capsule Take 3 mg by mouth at bedtime.    Historical Provider, MD  budesonide-formoterol (SYMBICORT) 80-4.5 MCG/ACT inhaler Inhale 2 puffs into the lungs 2 (two) times daily.    Historical Provider, MD  buPROPion (WELLBUTRIN SR) 150 MG 12 hr tablet Take 150 mg by mouth daily.    Historical Provider, MD  Calcium Carbonate (CALCIUM 600 PO) Take 600 mg by mouth daily.    Historical Provider, MD  Cholecalciferol (VITAMIN D-3 PO) Take 2,000 mg by mouth daily.     Historical Provider, MD  esomeprazole (NEXIUM) 40 MG capsule Take 40 mg by mouth 2 (two) times daily.     Historical Provider, MD  fentaNYL (DURAGESIC - DOSED MCG/HR) 50 MCG/HR Place 1 patch onto the skin every other day.    Historical Provider, MD  gabapentin (NEURONTIN) 600 MG tablet Take 1,800 mg by mouth 2 (two) times daily.     Historical Provider, MD  nitroGLYCERIN (NITROSTAT) 0.4 MG SL tablet Place 0.4 mg under the tongue every 5 (five) minutes as needed  for chest pain.    Historical Provider, MD  Oxycodone HCl 10 MG TABS Take 10 mg by mouth See admin instructions. Take 1 tablet (10 mg) by mouth 8-12 times daily    Historical Provider, MD  tiZANidine (ZANAFLEX) 4 MG tablet Take 12-24 mg by mouth daily. 3-6 tablets (12-24 mg) daily as tolerated    Historical Provider, MD  traZODone (DESYREL) 50 MG tablet Take 200 mg by mouth at bedtime.     Historical Provider, MD    Family History  No family history on file.  Social History  History   Social History  . Marital Status: Widowed    Spouse Name: N/A    Number of Children: N/A  . Years of Education:  N/A   Occupational History  . Not on file.   Social History Main Topics  . Smoking status: Current Every Day Smoker -- 2.00 packs/day  . Smokeless tobacco: Never Used  . Alcohol Use: No  . Drug Use: No  . Sexual Activity: Not on file   Other Topics Concern  . Not on file   Social History Narrative  . No narrative on file     Review of Systems, as per HPI, otherwise negative General:  No chills, fever, night sweats or weight changes.  Cardiovascular:  No chest pain, dyspnea on exertion, edema, orthopnea, palpitations, paroxysmal nocturnal dyspnea. Dermatological: No rash, lesions/masses Respiratory: No cough, dyspnea Urologic: No hematuria, dysuria Abdominal:   No nausea, vomiting, diarrhea, bright red blood per rectum, melena, or hematemesis Neurologic:  No visual changes, wkns, changes in mental status. All other systems reviewed and are otherwise negative except as noted above.  Physical Exam  There were no vitals taken for this visit.  General: Pleasant, NAD Psych: Normal affect. Neuro: Alert and oriented X 3. Moves all extremities spontaneously. HEENT: Normal  Neck: Supple without bruits or JVD. Lungs:  Resp regular and unlabored, CTA. Heart: RRR no s3, s4, or murmurs. Abdomen: Soft, non-tender, non-distended, BS + x 4.  Extremities: No clubbing, cyanosis or edema. DP/PT/Radials 2+ and equal bilaterally.  Labs:  No results for input(s): CKTOTAL, CKMB, TROPONINI in the last 72 hours. Lab Results  Component Value Date   WBC 7.9 01/30/2014   HGB 14.6 01/30/2014   HCT 43.2 01/30/2014   MCV 93.5 01/30/2014   PLT 278 01/30/2014    No results found for: DDIMER Invalid input(s): POCBNP    Component Value Date/Time   NA 138 01/30/2014 1813   K 4.0 01/30/2014 1813   CL 99 01/30/2014 1813   CO2 22 01/30/2014 1813   GLUCOSE 106* 01/30/2014 1813   BUN 10 01/30/2014 1813   CREATININE 0.72 01/30/2014 1813   CALCIUM 9.5 01/30/2014 1813   GFRNONAA 86* 01/30/2014  1813   GFRAA >90 01/30/2014 1813   No results found for: CHOL, HDL, LDLCALC, TRIG  Accessory Clinical Findings  Echocardiogram - none  ECG -SR, LAD, LAFB,     Assessment & Plan  68 year old female  1. Chest pain - relieved by nitroglycerin in our office, EKG unchanged from prior on 03/07/2014. The patient is advised to go to the ER, however she states that she has been to the ER several times and is tired of it. She is agreeable to have a stress test that he will schedule for later this week. She is unable to walk as she is preparing for hip replacement therefore we will use complex is again. She has COPD but she is not actively  wheezing great now. If her stress test is normal we will clear her for upcoming surgery. We will also prescribe her nitroglycerin 0.4 mg as needed.  2. Hypertension - borderline diastolic pressure, however she seems to be in mild distress. We will recheck at the time of stress test and start on medication as needed.  3. Preoperative evaluation - we will decide based on stress test results  Follow up after the stress test.   Dorothy Spark, MD, [facility] 04/07/2014, 1:57 PM

## 2014-04-09 DIAGNOSIS — I25110 Atherosclerotic heart disease of native coronary artery with unstable angina pectoris: Secondary | ICD-10-CM | POA: Diagnosis not present

## 2014-04-09 DIAGNOSIS — R609 Edema, unspecified: Secondary | ICD-10-CM | POA: Diagnosis not present

## 2014-04-09 DIAGNOSIS — D692 Other nonthrombocytopenic purpura: Secondary | ICD-10-CM | POA: Diagnosis not present

## 2014-04-09 DIAGNOSIS — F419 Anxiety disorder, unspecified: Secondary | ICD-10-CM | POA: Diagnosis not present

## 2014-04-14 ENCOUNTER — Encounter (HOSPITAL_COMMUNITY): Payer: Medicare Other

## 2014-04-16 ENCOUNTER — Institutional Professional Consult (permissible substitution): Payer: Medicare Other | Admitting: Cardiology

## 2014-04-17 DIAGNOSIS — M5416 Radiculopathy, lumbar region: Secondary | ICD-10-CM | POA: Diagnosis not present

## 2014-04-17 DIAGNOSIS — M7060 Trochanteric bursitis, unspecified hip: Secondary | ICD-10-CM | POA: Diagnosis not present

## 2014-04-17 DIAGNOSIS — M47817 Spondylosis without myelopathy or radiculopathy, lumbosacral region: Secondary | ICD-10-CM | POA: Diagnosis not present

## 2014-04-24 ENCOUNTER — Encounter (HOSPITAL_COMMUNITY): Payer: Medicare Other

## 2014-05-08 DIAGNOSIS — G4700 Insomnia, unspecified: Secondary | ICD-10-CM | POA: Diagnosis not present

## 2014-05-08 DIAGNOSIS — Z79899 Other long term (current) drug therapy: Secondary | ICD-10-CM | POA: Diagnosis not present

## 2014-05-08 DIAGNOSIS — F413 Other mixed anxiety disorders: Secondary | ICD-10-CM | POA: Diagnosis not present

## 2014-05-15 ENCOUNTER — Ambulatory Visit (INDEPENDENT_AMBULATORY_CARE_PROVIDER_SITE_OTHER): Payer: Medicare Other | Admitting: Cardiology

## 2014-05-15 ENCOUNTER — Encounter: Payer: Self-pay | Admitting: Cardiology

## 2014-05-15 ENCOUNTER — Telehealth: Payer: Self-pay | Admitting: Cardiology

## 2014-05-15 VITALS — BP 116/64 | HR 80 | Ht 61.25 in | Wt 109.4 lb

## 2014-05-15 DIAGNOSIS — Z720 Tobacco use: Secondary | ICD-10-CM | POA: Diagnosis not present

## 2014-05-15 DIAGNOSIS — Z01812 Encounter for preprocedural laboratory examination: Secondary | ICD-10-CM

## 2014-05-15 DIAGNOSIS — F17200 Nicotine dependence, unspecified, uncomplicated: Secondary | ICD-10-CM

## 2014-05-15 DIAGNOSIS — R079 Chest pain, unspecified: Secondary | ICD-10-CM

## 2014-05-15 DIAGNOSIS — I10 Essential (primary) hypertension: Secondary | ICD-10-CM | POA: Diagnosis not present

## 2014-05-15 DIAGNOSIS — M5416 Radiculopathy, lumbar region: Secondary | ICD-10-CM | POA: Diagnosis not present

## 2014-05-15 DIAGNOSIS — M47817 Spondylosis without myelopathy or radiculopathy, lumbosacral region: Secondary | ICD-10-CM | POA: Diagnosis not present

## 2014-05-15 LAB — COMPREHENSIVE METABOLIC PANEL
ALT: 13 U/L (ref 0–35)
AST: 17 U/L (ref 0–37)
Albumin: 3.7 g/dL (ref 3.5–5.2)
Alkaline Phosphatase: 74 U/L (ref 39–117)
BUN: 13 mg/dL (ref 6–23)
CO2: 30 mEq/L (ref 19–32)
Calcium: 9.4 mg/dL (ref 8.4–10.5)
Chloride: 100 mEq/L (ref 96–112)
Creatinine, Ser: 0.9 mg/dL (ref 0.4–1.2)
GFR: 64.43 mL/min (ref >60.00)
Glucose, Bld: 80 mg/dL (ref 70–99)
Potassium: 4 mEq/L (ref 3.5–5.1)
Sodium: 136 mEq/L (ref 135–145)
Total Bilirubin: 0.5 mg/dL (ref 0.2–1.2)
Total Protein: 6.4 g/dL (ref 6.0–8.3)

## 2014-05-15 LAB — CBC WITH DIFFERENTIAL/PLATELET
Basophils Absolute: 0 10*3/uL (ref 0.0–0.1)
Basophils Relative: 0.5 % (ref 0.0–3.0)
Eosinophils Absolute: 0.1 10*3/uL (ref 0.0–0.7)
Eosinophils Relative: 1.4 % (ref 0.0–5.0)
HCT: 46.7 % — ABNORMAL HIGH (ref 36.0–46.0)
Hemoglobin: 15.5 g/dL — ABNORMAL HIGH (ref 12.0–15.0)
Lymphocytes Relative: 18.4 % (ref 12.0–46.0)
Lymphs Abs: 1.4 10*3/uL (ref 0.7–4.0)
MCHC: 33.1 g/dL (ref 30.0–36.0)
MCV: 95.2 fl (ref 78.0–100.0)
Monocytes Absolute: 0.6 10*3/uL (ref 0.1–1.0)
Monocytes Relative: 7.6 % (ref 3.0–12.0)
Neutro Abs: 5.3 10*3/uL (ref 1.4–7.7)
Neutrophils Relative %: 72.1 % (ref 43.0–77.0)
Platelets: 263 10*3/uL (ref 150.0–400.0)
RBC: 4.9 Mil/uL (ref 3.87–5.11)
RDW: 14.5 % (ref 11.5–15.5)
WBC: 7.3 10*3/uL (ref 4.0–10.5)

## 2014-05-15 LAB — PROTIME-INR
INR: 1 ratio (ref 0.8–1.0)
Prothrombin Time: 10.9 s (ref 9.6–13.1)

## 2014-05-15 LAB — APTT: aPTT: 26.5 s (ref 23.4–32.7)

## 2014-05-15 NOTE — Telephone Encounter (Signed)
error 

## 2014-05-15 NOTE — Patient Instructions (Signed)
Your physician recommends that you continue on your current medications as directed. Please refer to the Current Medication list given to you today.  Your physician has requested that you have a cardiac catheterization. Cardiac catheterization is used to diagnose and/or treat various heart conditions. Doctors may recommend this procedure for a number of different reasons. The most common reason is to evaluate chest pain. Chest pain can be a symptom of coronary artery disease (CAD), and cardiac catheterization can show whether plaque is narrowing or blocking your heart's arteries. This procedure is also used to evaluate the valves, as well as measure the blood flow and oxygen levels in different parts of your heart. For further information please visit [email or web address]. Please follow instruction sheet, as given.  Your physician recommends that you return for lab work in: today (CBCD, CMET, PT and PTT)  Your physician recommends that you schedule a follow-up appointment in: after test

## 2014-05-15 NOTE — Progress Notes (Signed)
Patient ID: Tangy L Ghosh, female   DOB: 08/27/1945, 68 y.o.   MRN: [id number] Patient ID: Desarea L Grewe, female   DOB: 05/14/1946, 68 y.o.   MRN: [id number]    Patient Name: Naliya L Ribaudo Date of Encounter: 05/15/2014  Primary Care Provider:  SANDERS,ROBYN N, MD Primary Cardiologist:  Rotha Cassels H  Problem List   Past Medical History  Diagnosis Date  . Arthritis   . Osteoporosis   . COPD (chronic obstructive pulmonary disease)    No past surgical history on file.  Allergies  Allergies  Allergen Reactions  . Aspirin Other (See Comments)    Beginnings of ulcer  . Ciprofloxacin Other (See Comments)    Possible hand tremors  . Codeine Nausea Only  . Contrast Media [Iodinated Diagnostic Agents] Other (See Comments)    dizziness  . Erythromycin Base Other (See Comments)    Unknown allergic reaction  . Gadolinium Derivatives Other (See Comments)    dizziness  . Ibuprofen Other (See Comments)    Upsets stomach  . Penicillins Swelling    Diagnosed with allergy as a child    HPI  A very pleasant 68-year-old female with past medical history of COPD, hypertension, and family history of premature coronary artery disease ( Brother had a myocardial infarction in his 50s) with concern of retrosternal chest pain. The patient states that she has been trying to quit smoking and has been experiencing episodes of anxiety associated with retrosternal chest pains that radiates to her left arm. She is coming also for a preop evaluation for hip replacement. The patient states that for the last month she has been experiencing these pains that are also associated with shortness of breath. She has been trying nitroglycerin pills did give her some relief. She is using Xanax twice a day for relief of her anxiety. She is still trying to quit smoking. She denies any palpitations or syncope. No orthopnea or paroxysmal nocturnal dyspnea.  05/15/2014 - the patient is coming after 1 months, she didn't  undergo a stress test as she was worrying about having VT. she didn't show up for her scheduled stress test. She continues to smoke. She has daily angina switched relief with nitroglycerin. She looks very fragile and needs her hip surgery for which she needs clearance.  Home Medications  Prior to Admission medications   Medication Sig Start Date End Date Taking? Authorizing Provider  albuterol (PROVENTIL HFA;VENTOLIN HFA) 108 (90 BASE) MCG/ACT inhaler Inhale 2 puffs into the lungs every 6 (six) hours as needed for wheezing or shortness of breath.    Historical Provider, MD  alprazolam (XANAX) 2 MG tablet Take 2 mg by mouth 3 (three) times daily.     Historical Provider, MD  aspirin EC 81 MG tablet Take 81 mg by mouth daily.    Historical Provider, MD  budesonide (ENTOCORT EC) 3 MG 24 hr capsule Take 3 mg by mouth at bedtime.    Historical Provider, MD  budesonide-formoterol (SYMBICORT) 80-4.5 MCG/ACT inhaler Inhale 2 puffs into the lungs 2 (two) times daily.    Historical Provider, MD  buPROPion (WELLBUTRIN SR) 150 MG 12 hr tablet Take 150 mg by mouth daily.    Historical Provider, MD  Calcium Carbonate (CALCIUM 600 PO) Take 600 mg by mouth daily.    Historical Provider, MD  Cholecalciferol (VITAMIN D-3 PO) Take 2,000 mg by mouth daily.     Historical Provider, MD  esomeprazole (NEXIUM) 40 MG capsule Take 40 mg by mouth 2 (  two) times daily.     Historical Provider, MD  fentaNYL (DURAGESIC - DOSED MCG/HR) 50 MCG/HR Place 1 patch onto the skin every other day.    Historical Provider, MD  gabapentin (NEURONTIN) 600 MG tablet Take 1,800 mg by mouth 2 (two) times daily.     Historical Provider, MD  nitroGLYCERIN (NITROSTAT) 0.4 MG SL tablet Place 0.4 mg under the tongue every 5 (five) minutes as needed for chest pain.    Historical Provider, MD  Oxycodone HCl 10 MG TABS Take 10 mg by mouth See admin instructions. Take 1 tablet (10 mg) by mouth 8-12 times daily    Historical Provider, MD  tiZANidine  (ZANAFLEX) 4 MG tablet Take 12-24 mg by mouth daily. 3-6 tablets (12-24 mg) daily as tolerated    Historical Provider, MD  traZODone (DESYREL) 50 MG tablet Take 200 mg by mouth at bedtime.     Historical Provider, MD    Family History  No family history on file.  Social History  History   Social History  . Marital Status: Widowed    Spouse Name: N/A    Number of Children: N/A  . Years of Education: N/A   Occupational History  . Not on file.   Social History Main Topics  . Smoking status: Current Every Day Smoker -- 2.00 packs/day  . Smokeless tobacco: Never Used  . Alcohol Use: No  . Drug Use: No  . Sexual Activity: Not on file   Other Topics Concern  . Not on file   Social History Narrative     Review of Systems, as per HPI, otherwise negative General:  No chills, fever, night sweats or weight changes.  Cardiovascular:  No chest pain, dyspnea on exertion, edema, orthopnea, palpitations, paroxysmal nocturnal dyspnea. Dermatological: No rash, lesions/masses Respiratory: No cough, dyspnea Urologic: No hematuria, dysuria Abdominal:   No nausea, vomiting, diarrhea, bright red blood per rectum, melena, or hematemesis Neurologic:  No visual changes, wkns, changes in mental status. All other systems reviewed and are otherwise negative except as noted above.  Physical Exam  Blood pressure 116/64, pulse 80, height 5' 1.25" (1.556 m), weight 109 lb 6.4 oz (49.624 kg), SpO2 97 %.  General: Pleasant, NAD Psych: Normal affect. Neuro: Alert and oriented X 3. Moves all extremities spontaneously. HEENT: Normal  Neck: Supple without bruits or JVD. Lungs:  Resp regular and unlabored, CTA. Heart: RRR no s3, s4, or murmurs. Abdomen: Soft, non-tender, non-distended, BS + x 4.  Extremities: No clubbing, cyanosis or edema. DP/PT/Radials 2+ and equal bilaterally.  Labs:  No results for input(s): CKTOTAL, CKMB, TROPONINI in the last 72 hours. Lab Results  Component Value Date    WBC 7.9 01/30/2014   HGB 14.6 01/30/2014   HCT 43.2 01/30/2014   MCV 93.5 01/30/2014   PLT 278 01/30/2014    No results found for: DDIMER Invalid input(s): POCBNP    Component Value Date/Time   NA 138 01/30/2014 1813   K 4.0 01/30/2014 1813   CL 99 01/30/2014 1813   CO2 22 01/30/2014 1813   GLUCOSE 106* 01/30/2014 1813   BUN 10 01/30/2014 1813   CREATININE 0.72 01/30/2014 1813   CALCIUM 9.5 01/30/2014 1813   GFRNONAA 86* 01/30/2014 1813   GFRAA >90 01/30/2014 1813   No results found for: CHOL, HDL, LDLCALC, TRIG  Accessory Clinical Findings  Echocardiogram - none  ECG -SR, LAD, LAFB,     Assessment & Plan  68-year-old female  1. Chest pain - relieved   by nitroglycerin in our office and at home , EKG unchanged from prior on 03/07/2014. The patient is advised to go to the ER, however she states that she has been to the ER several times and is tired of it. She was agreeable to have a stress test but then didn't show up as she is worrying about possible complications. She is aggreable to have a cath done, we will schedule one of 06/03/2014 with Dr Smith. Continue ASA, no betablockers as she has COPD.  We will check precath labs and lipids (none yet)< statin dose based on the results. Continue nitroglycerin 0.4 mg as needed.  2. Hypertension - controlled  3. Preoperative evaluation - we will decide based on cath results  4. COPD but she is not actively wheezing right now, smoking cessation counsaling provided, seems very difficult for her.    Follow up after the cath.  Danea Manter H, MD, FACC 05/15/2014, 12:28 PM     

## 2014-05-15 NOTE — Telephone Encounter (Signed)
**Note De-Identified [name] [name] Obfuscation** The pt is advised that we got all the labs we needed for her cath while she was in the office today. She verbalized understanding.

## 2014-05-15 NOTE — Telephone Encounter (Signed)
New problem    Pt need to know if she need to have labs done at [facility] prior to her procedure. Please call pt if not home leave message.

## 2014-05-16 ENCOUNTER — Encounter (HOSPITAL_COMMUNITY): Payer: Medicare Other

## 2014-05-26 ENCOUNTER — Telehealth: Payer: Self-pay | Admitting: Cardiology

## 2014-05-26 NOTE — Telephone Encounter (Signed)
New message     Pt is having a cath on 06-03-14.  Before the appt, she want to talk to Dr Meda Coffee about the cath.  Please call anytime between now and then after 10:45

## 2014-05-26 NOTE — Telephone Encounter (Signed)
Will forward to Dr Meda Coffee for further review and follow-up

## 2014-05-27 ENCOUNTER — Telehealth: Payer: Self-pay | Admitting: *Deleted

## 2014-05-27 DIAGNOSIS — Z9889 Other specified postprocedural states: Principal | ICD-10-CM

## 2014-05-27 DIAGNOSIS — Z8679 Personal history of other diseases of the circulatory system: Secondary | ICD-10-CM

## 2014-05-27 NOTE — Telephone Encounter (Signed)
Dr Meda Coffee working on this referral, but need a more specific diagnosis to associate with this referral.  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

## 2014-05-27 NOTE — Telephone Encounter (Signed)
-----   Message from Dorothy Spark, MD sent at 05/27/2014  4:19 PM EST ----- Mrs Mailyn needs to be referred to neurosurgery: dg : h/o subarachnoid aneurysm  Thank you, KN

## 2014-05-27 NOTE — Telephone Encounter (Signed)
Maybe brain aneurysm, s/p repair

## 2014-05-28 NOTE — Telephone Encounter (Signed)
Pt aware of referral.  Will send [facility] a message to follow-up with this referral.

## 2014-06-03 ENCOUNTER — Ambulatory Visit (HOSPITAL_COMMUNITY)
Admission: RE | Admit: 2014-06-03 | Discharge: 2014-06-03 | Disposition: A | Discharge status: Home / Self Care | Payer: Medicare Other | Source: Ambulatory Visit | Attending: Interventional Cardiology | Admitting: Interventional Cardiology

## 2014-06-03 ENCOUNTER — Encounter (HOSPITAL_COMMUNITY): Payer: Self-pay | Admitting: Interventional Cardiology

## 2014-06-03 ENCOUNTER — Encounter (HOSPITAL_COMMUNITY)
Admission: RE | Disposition: A | Discharge status: Home / Self Care | Payer: Self-pay | Source: Ambulatory Visit | Attending: Interventional Cardiology

## 2014-06-03 DIAGNOSIS — I10 Essential (primary) hypertension: Secondary | ICD-10-CM | POA: Diagnosis not present

## 2014-06-03 DIAGNOSIS — J449 Chronic obstructive pulmonary disease, unspecified: Secondary | ICD-10-CM | POA: Insufficient documentation

## 2014-06-03 DIAGNOSIS — M810 Age-related osteoporosis without current pathological fracture: Secondary | ICD-10-CM | POA: Diagnosis not present

## 2014-06-03 DIAGNOSIS — F419 Anxiety disorder, unspecified: Secondary | ICD-10-CM | POA: Diagnosis present

## 2014-06-03 DIAGNOSIS — F17210 Nicotine dependence, cigarettes, uncomplicated: Secondary | ICD-10-CM | POA: Diagnosis not present

## 2014-06-03 DIAGNOSIS — R079 Chest pain, unspecified: Secondary | ICD-10-CM | POA: Insufficient documentation

## 2014-06-03 DIAGNOSIS — M1990 Unspecified osteoarthritis, unspecified site: Secondary | ICD-10-CM | POA: Diagnosis not present

## 2014-06-03 DIAGNOSIS — Z7982 Long term (current) use of aspirin: Secondary | ICD-10-CM | POA: Diagnosis not present

## 2014-06-03 DIAGNOSIS — R0789 Other chest pain: Secondary | ICD-10-CM | POA: Diagnosis not present

## 2014-06-03 HISTORY — PX: LEFT HEART CATHETERIZATION WITH CORONARY ANGIOGRAM: SHX5451

## 2014-06-03 LAB — CBC
HCT: 43.2 % (ref 36.0–46.0)
Hemoglobin: 14.8 g/dL (ref 12.0–15.0)
MCH: 31.4 pg (ref 26.0–34.0)
MCHC: 34.3 g/dL (ref 30.0–36.0)
MCV: 91.7 fL (ref 78.0–100.0)
Platelets: 215 10*3/uL (ref 150–400)
RBC: 4.71 MIL/uL (ref 3.87–5.11)
RDW: 13.6 % (ref 11.5–15.5)
WBC: 6.8 10*3/uL (ref 4.0–10.5)

## 2014-06-03 LAB — BASIC METABOLIC PANEL
Anion gap: 10 (ref 5–15)
BUN: 14 mg/dL (ref 6–23)
CO2: 26 mmol/L (ref 19–32)
Calcium: 9.6 mg/dL (ref 8.4–10.5)
Chloride: 101 mEq/L (ref 96–112)
Creatinine, Ser: 0.96 mg/dL (ref 0.50–1.10)
GFR calc Af Amer: 69 mL/min — ABNORMAL LOW (ref >90)
GFR calc non Af Amer: 59 mL/min — ABNORMAL LOW (ref >90)
GLUCOSE: 95 mg/dL (ref 70–99)
Potassium: 3.5 mmol/L (ref 3.5–5.1)
SODIUM: 137 mmol/L (ref 135–145)

## 2014-06-03 LAB — PROTIME-INR
INR: 1.03 (ref 0.00–1.49)
PROTHROMBIN TIME: 13.6 s (ref 11.6–15.2)

## 2014-06-03 SURGERY — LEFT HEART CATHETERIZATION WITH CORONARY ANGIOGRAM
Anesthesia: LOCAL

## 2014-06-03 MED ORDER — ASPIRIN 81 MG PO CHEW
CHEWABLE_TABLET | ORAL | Status: AC
  Administered 2014-06-03: 81 mg via ORAL
  Filled 2014-06-03: qty 1

## 2014-06-03 MED ORDER — SODIUM CHLORIDE 0.9 % IJ SOLN: 3.0000 mL | Freq: Two times a day (BID) | INTRAMUSCULAR | Status: DC

## 2014-06-03 MED ORDER — ONDANSETRON HCL 4 MG/2ML IJ SOLN: 4.0000 mg | Freq: Four times a day (QID) | INTRAMUSCULAR | Status: DC | PRN

## 2014-06-03 MED ORDER — ASPIRIN 81 MG PO CHEW
81.0000 mg | CHEWABLE_TABLET | ORAL | Status: AC
  Administered 2014-06-03: 81 mg via ORAL

## 2014-06-03 MED ORDER — SODIUM CHLORIDE 0.9 % IV SOLN
INTRAVENOUS | Status: DC
Start: 2014-06-03 — End: 2014-06-03
  Administered 2014-06-03: 08:00:00 via INTRAVENOUS

## 2014-06-03 MED ORDER — SODIUM CHLORIDE 0.9 % IJ SOLN: 3.0000 mL | INTRAMUSCULAR | Status: DC | PRN

## 2014-06-03 MED ORDER — MIDAZOLAM HCL 2 MG/2ML IJ SOLN
INTRAMUSCULAR | Status: AC
  Filled 2014-06-03: qty 2

## 2014-06-03 MED ORDER — SODIUM CHLORIDE 0.9 % IV SOLN: INTRAVENOUS | Status: DC

## 2014-06-03 MED ORDER — SODIUM CHLORIDE 0.9 % IV SOLN: 250.0000 mL | INTRAVENOUS | Status: DC | PRN

## 2014-06-03 MED ORDER — ACETAMINOPHEN 325 MG PO TABS
650.0000 mg | ORAL_TABLET | ORAL | Status: DC | PRN
  Administered 2014-06-03: 650 mg via ORAL

## 2014-06-03 MED ORDER — HEPARIN (PORCINE) IN NACL 2-0.9 UNIT/ML-% IJ SOLN
INTRAMUSCULAR | Status: AC
  Filled 2014-06-03: qty 1000

## 2014-06-03 MED ORDER — LIDOCAINE HCL (PF) 1 % IJ SOLN
INTRAMUSCULAR | Status: AC
Start: 2014-06-03 — End: 2014-06-03
  Filled 2014-06-03: qty 30

## 2014-06-03 MED ORDER — ACETAMINOPHEN 325 MG PO TABS
ORAL_TABLET | ORAL | Status: AC
  Filled 2014-06-03: qty 2

## 2014-06-03 MED ORDER — NITROGLYCERIN 1 MG/10 ML FOR IR/CATH LAB
INTRA_ARTERIAL | Status: AC
  Filled 2014-06-03: qty 10

## 2014-06-03 MED ORDER — OXYCODONE-ACETAMINOPHEN 5-325 MG PO TABS
ORAL_TABLET | ORAL | Status: AC
  Filled 2014-06-03: qty 1

## 2014-06-03 MED ORDER — OXYCODONE-ACETAMINOPHEN 5-325 MG PO TABS
1.0000 | ORAL_TABLET | Freq: Four times a day (QID) | ORAL | Status: DC
  Administered 2014-06-03: 1 via ORAL

## 2014-06-03 MED ORDER — FENTANYL CITRATE 0.05 MG/ML IJ SOLN
INTRAMUSCULAR | Status: AC
  Filled 2014-06-03: qty 2

## 2014-06-03 NOTE — Discharge Instructions (Signed)

## 2014-06-03 NOTE — Progress Notes (Signed)
Site area: rt groin Site Prior to Removal:  Level 0 Pressure Applied For: 20 minutes Manual:   yes Patient Status During Pull:  stable Post Pull Site:  Level 0 Post Pull Instructions Given:  yes Post Pull Pulses Present: yes Dressing Applied:  tegaderm Bedrest begins @ 1110 Comments: no complications

## 2014-06-03 NOTE — H&P (View-Only) (Signed)
Patient ID: [name] [name], female   DOB: 06-03-1946, 68 y.o.   MRN: [id number] Patient ID: [name] [name], female   DOB: 04-22-1946, 68 y.o.   MRN: [id number]    Patient Name: [name] [name] Date of Encounter: 05/15/2014  Primary Care Provider:  Maximino Greenland, MD Primary Cardiologist:  Dorothy Spark  Problem List   Past Medical History  Diagnosis Date  . Arthritis   . Osteoporosis   . COPD (chronic obstructive pulmonary disease)    No past surgical history on file.  Allergies  Allergies  Allergen Reactions  . Aspirin Other (See Comments)    Beginnings of ulcer  . Ciprofloxacin Other (See Comments)    Possible hand tremors  . Codeine Nausea Only  . Contrast Media [Iodinated Diagnostic Agents] Other (See Comments)    dizziness  . Erythromycin Base Other (See Comments)    Unknown allergic reaction  . Gadolinium Derivatives Other (See Comments)    dizziness  . Ibuprofen Other (See Comments)    Upsets stomach  . Penicillins Swelling    Diagnosed with allergy as a child    HPI  A very pleasant 68 year old female with past medical history of COPD, hypertension, and family history of premature coronary artery disease ( Brother had a myocardial infarction in his 62s) with concern of retrosternal chest pain. The patient states that she has been trying to quit smoking and has been experiencing episodes of anxiety associated with retrosternal chest pains that radiates to her left arm. She is coming also for a preop evaluation for hip replacement. The patient states that for the last month she has been experiencing these pains that are also associated with shortness of breath. She has been trying nitroglycerin pills did give her some relief. She is using Xanax twice a day for relief of her anxiety. She is still trying to quit smoking. She denies any palpitations or syncope. No orthopnea or paroxysmal nocturnal dyspnea.  05/15/2014 - the patient is coming after 1 months, she didn't  undergo a stress test as she was worrying about having VT. she didn't show up for her scheduled stress test. She continues to smoke. She has daily angina switched relief with nitroglycerin. She looks very fragile and needs her hip surgery for which she needs clearance.  Home Medications  Prior to Admission medications   Medication Sig Start Date End Date Taking? Authorizing Provider  albuterol (PROVENTIL HFA;VENTOLIN HFA) 108 (90 BASE) MCG/ACT inhaler Inhale 2 puffs into the lungs every 6 (six) hours as needed for wheezing or shortness of breath.    Historical Provider, MD  alprazolam Duanne Moron) 2 MG tablet Take 2 mg by mouth 3 (three) times daily.     Historical Provider, MD  aspirin EC 81 MG tablet Take 81 mg by mouth daily.    Historical Provider, MD  budesonide (ENTOCORT EC) 3 MG 24 hr capsule Take 3 mg by mouth at bedtime.    Historical Provider, MD  budesonide-formoterol (SYMBICORT) 80-4.5 MCG/ACT inhaler Inhale 2 puffs into the lungs 2 (two) times daily.    Historical Provider, MD  buPROPion (WELLBUTRIN SR) 150 MG 12 hr tablet Take 150 mg by mouth daily.    Historical Provider, MD  Calcium Carbonate (CALCIUM 600 PO) Take 600 mg by mouth daily.    Historical Provider, MD  Cholecalciferol (VITAMIN D-3 PO) Take 2,000 mg by mouth daily.     Historical Provider, MD  esomeprazole (NEXIUM) 40 MG capsule Take 40 mg by mouth 2 (  two) times daily.     Historical Provider, MD  fentaNYL (DURAGESIC - DOSED MCG/HR) 50 MCG/HR Place 1 patch onto the skin every other day.    Historical Provider, MD  gabapentin (NEURONTIN) 600 MG tablet Take 1,800 mg by mouth 2 (two) times daily.     Historical Provider, MD  nitroGLYCERIN (NITROSTAT) 0.4 MG SL tablet Place 0.4 mg under the tongue every 5 (five) minutes as needed for chest pain.    Historical Provider, MD  Oxycodone HCl 10 MG TABS Take 10 mg by mouth See admin instructions. Take 1 tablet (10 mg) by mouth 8-12 times daily    Historical Provider, MD  tiZANidine  (ZANAFLEX) 4 MG tablet Take 12-24 mg by mouth daily. 3-6 tablets (12-24 mg) daily as tolerated    Historical Provider, MD  traZODone (DESYREL) 50 MG tablet Take 200 mg by mouth at bedtime.     Historical Provider, MD    Family History  No family history on file.  Social History  History   Social History  . Marital Status: Widowed    Spouse Name: N/A    Number of Children: N/A  . Years of Education: N/A   Occupational History  . Not on file.   Social History Main Topics  . Smoking status: Current Every Day Smoker -- 2.00 packs/day  . Smokeless tobacco: Never Used  . Alcohol Use: No  . Drug Use: No  . Sexual Activity: Not on file   Other Topics Concern  . Not on file   Social History Narrative     Review of Systems, as per HPI, otherwise negative General:  No chills, fever, night sweats or weight changes.  Cardiovascular:  No chest pain, dyspnea on exertion, edema, orthopnea, palpitations, paroxysmal nocturnal dyspnea. Dermatological: No rash, lesions/masses Respiratory: No cough, dyspnea Urologic: No hematuria, dysuria Abdominal:   No nausea, vomiting, diarrhea, bright red blood per rectum, melena, or hematemesis Neurologic:  No visual changes, wkns, changes in mental status. All other systems reviewed and are otherwise negative except as noted above.  Physical Exam  Blood pressure 116/64, pulse 80, height 5' 1.25" (1.556 m), weight 109 lb 6.4 oz (49.624 kg), SpO2 97 %.  General: Pleasant, NAD Psych: Normal affect. Neuro: Alert and oriented X 3. Moves all extremities spontaneously. HEENT: Normal  Neck: Supple without bruits or JVD. Lungs:  Resp regular and unlabored, CTA. Heart: RRR no s3, s4, or murmurs. Abdomen: Soft, non-tender, non-distended, BS + x 4.  Extremities: No clubbing, cyanosis or edema. DP/PT/Radials 2+ and equal bilaterally.  Labs:  No results for input(s): CKTOTAL, CKMB, TROPONINI in the last 72 hours. Lab Results  Component Value Date    WBC 7.9 01/30/2014   HGB 14.6 01/30/2014   HCT 43.2 01/30/2014   MCV 93.5 01/30/2014   PLT 278 01/30/2014    No results found for: DDIMER Invalid input(s): POCBNP    Component Value Date/Time   NA 138 01/30/2014 1813   K 4.0 01/30/2014 1813   CL 99 01/30/2014 1813   CO2 22 01/30/2014 1813   GLUCOSE 106* 01/30/2014 1813   BUN 10 01/30/2014 1813   CREATININE 0.72 01/30/2014 1813   CALCIUM 9.5 01/30/2014 1813   GFRNONAA 86* 01/30/2014 1813   GFRAA >90 01/30/2014 1813   No results found for: CHOL, HDL, LDLCALC, TRIG  Accessory Clinical Findings  Echocardiogram - none  ECG -SR, LAD, LAFB,     Assessment & Plan  68 year old female  1. Chest pain - relieved  by nitroglycerin in our office and at home , EKG unchanged from prior on 03/07/2014. The patient is advised to go to the ER, however she states that she has been to the ER several times and is tired of it. She was agreeable to have a stress test but then didn't show up as she is worrying about possible complications. She is aggreable to have a cath done, we will schedule one of 06/03/2014 with Dr Tamala Julian. Continue ASA, no betablockers as she has COPD.  We will check precath labs and lipids (none yet)< statin dose based on the results. Continue nitroglycerin 0.4 mg as needed.  2. Hypertension - controlled  3. Preoperative evaluation - we will decide based on cath results  4. COPD but she is not actively wheezing right now, smoking cessation counsaling provided, seems very difficult for her.    Follow up after the cath.  Dorothy Spark, MD, [facility] 05/15/2014, 12:28 PM

## 2014-06-03 NOTE — Progress Notes (Signed)
Rennis Harding RN was notified of patients sensitivities to contrast dye and asprin.

## 2014-06-03 NOTE — CV Procedure (Signed)
     Left Heart Catheterization with Coronary Angiography  Report  YOHANA BARTHA  68 y.o.  female 11/27/1945  Procedure Date: 06/03/2014 Referring Physician: Glendale Chard M.D. Primary Cardiologist: Ena Dawley, M.D.  INDICATIONS: Chest pain in a late middle aged female with a smoking history. The study is requested to rule out coronary artery disease  PROCEDURE: 1. Left heart catheterization; 2. Coronary angiography; 3. Left ventriculography  CONSENT:  The risks, benefits, and details of the procedure were explained in detail to the patient. Risks including death, stroke, heart attack, kidney injury, allergy, limb ischemia, bleeding and radiation injury were discussed.  The patient verbalized understanding and wanted to proceed.  Informed written consent was obtained.  PROCEDURE TECHNIQUE:  After Xylocaine anesthesia a 5 French sheath was placed in the right femoral artery using the modified Seldinger technique.  Coronary angiography was done using a 5 F A2 MP catheter.  Left ventriculography was done using the A2 MP catheter and hand injection.   Hemostasis achieved by manual compression.   CONTRAST:  Total of 45 cc.  COMPLICATIONS:  None   HEMODYNAMICS:  Aortic pressure 125/67 mmHg 119 /4 mmHg; LV pressure 20 mmHg  ANGIOGRAPHIC DATA:   The left main coronary artery is normal.  The left anterior descending artery is transapical and normal.  The left circumflex artery is normal.  The right coronary artery is dominant and normal.   LEFT VENTRICULOGRAM:  Left ventricular angiogram was done in the 30 RAO projection and revealed a normal cavity size and normal contractility. EF 70%   IMPRESSIONS:  1. Normal coronary arteries 2. The left ventricular systolic function with elevated end-diastolic pressure 3. Chest pain during the catheterization procedure not correlated with spasm or other coronary abnormality   RECOMMENDATION:  Investigate alternative explanations for  chest pain.Marland Kitchen

## 2014-06-03 NOTE — Progress Notes (Signed)
Family came to get me, pt removed IV/ no bleeding or trauma to arm and monitoring equipment removed. I spoke with Dr Tamala Julian, he said it was ok to go and he is aware there is no one at home for 24 hrs post, pt ambulated without incident and dc instructions given to pt and daughter and copy given.

## 2014-06-03 NOTE — Interval H&P Note (Signed)
Cath Lab Visit (complete for each Cath Lab visit)  Clinical Evaluation Leading to the Procedure:   ACS: No.  Non-ACS:    Anginal Classification: CCS II  Anti-ischemic medical therapy: No Therapy  Non-Invasive Test Results: No non-invasive testing performed  Prior CABG: No previous CABG      History and Physical Interval Note:  06/03/2014 10:09 AM  [name] [name]  has presented today for surgery, with the diagnosis of cp  The various methods of treatment have been discussed with the patient and family. After consideration of risks, benefits and other options for treatment, the patient has consented to  Procedure(s): LEFT HEART CATHETERIZATION WITH CORONARY ANGIOGRAM (N/A) as a surgical intervention .  The patient's history has been reviewed, patient examined, no change in status, stable for surgery.  I have reviewed the patient's chart and labs.  Questions were answered to the patient's satisfaction.     Sinclair Grooms

## 2014-06-19 DIAGNOSIS — M7060 Trochanteric bursitis, unspecified hip: Secondary | ICD-10-CM | POA: Diagnosis not present

## 2014-06-19 DIAGNOSIS — M5416 Radiculopathy, lumbar region: Secondary | ICD-10-CM | POA: Diagnosis not present

## 2014-06-19 DIAGNOSIS — M47817 Spondylosis without myelopathy or radiculopathy, lumbosacral region: Secondary | ICD-10-CM | POA: Diagnosis not present

## 2014-06-26 ENCOUNTER — Telehealth: Payer: Self-pay | Admitting: Cardiology

## 2014-06-26 NOTE — Telephone Encounter (Signed)
Gerald Stabs from [facility] has left 2 messages with the pt to call to schedule her an appointment, the pt is not returning her calls, so Gerald Stabs is closing the referral.  I called the pt and gave her the number for [facility] she said she may call to schedule.

## 2014-06-26 NOTE — Telephone Encounter (Signed)
Will route this message to Dr Meda Coffee for her review as well.

## 2014-07-07 ENCOUNTER — Other Ambulatory Visit: Payer: Self-pay | Admitting: Cardiology

## 2014-07-07 DIAGNOSIS — I671 Cerebral aneurysm, nonruptured: Secondary | ICD-10-CM

## 2014-07-09 ENCOUNTER — Ambulatory Visit (HOSPITAL_COMMUNITY): Payer: Medicare Other

## 2014-07-17 DIAGNOSIS — M5416 Radiculopathy, lumbar region: Secondary | ICD-10-CM | POA: Diagnosis not present

## 2014-07-17 DIAGNOSIS — M47817 Spondylosis without myelopathy or radiculopathy, lumbosacral region: Secondary | ICD-10-CM | POA: Diagnosis not present

## 2014-07-17 DIAGNOSIS — M791 Myalgia: Secondary | ICD-10-CM | POA: Diagnosis not present

## 2014-08-07 DIAGNOSIS — Z23 Encounter for immunization: Secondary | ICD-10-CM | POA: Diagnosis not present

## 2014-08-07 DIAGNOSIS — Z87891 Personal history of nicotine dependence: Secondary | ICD-10-CM | POA: Diagnosis not present

## 2014-08-07 DIAGNOSIS — R7309 Other abnormal glucose: Secondary | ICD-10-CM | POA: Diagnosis not present

## 2014-08-07 DIAGNOSIS — E782 Mixed hyperlipidemia: Secondary | ICD-10-CM | POA: Diagnosis not present

## 2014-08-07 DIAGNOSIS — F419 Anxiety disorder, unspecified: Secondary | ICD-10-CM | POA: Diagnosis not present

## 2014-08-07 DIAGNOSIS — M25551 Pain in right hip: Secondary | ICD-10-CM | POA: Diagnosis not present

## 2014-08-07 DIAGNOSIS — R5383 Other fatigue: Secondary | ICD-10-CM | POA: Diagnosis not present

## 2014-08-07 DIAGNOSIS — R600 Localized edema: Secondary | ICD-10-CM | POA: Diagnosis not present

## 2014-08-07 DIAGNOSIS — N760 Acute vaginitis: Secondary | ICD-10-CM | POA: Diagnosis not present

## 2014-08-14 DIAGNOSIS — M47817 Spondylosis without myelopathy or radiculopathy, lumbosacral region: Secondary | ICD-10-CM | POA: Diagnosis not present

## 2014-08-14 DIAGNOSIS — M169 Osteoarthritis of hip, unspecified: Secondary | ICD-10-CM | POA: Diagnosis not present

## 2014-08-14 DIAGNOSIS — M5416 Radiculopathy, lumbar region: Secondary | ICD-10-CM | POA: Diagnosis not present

## 2014-09-18 DIAGNOSIS — M169 Osteoarthritis of hip, unspecified: Secondary | ICD-10-CM | POA: Diagnosis not present

## 2014-09-18 DIAGNOSIS — M47817 Spondylosis without myelopathy or radiculopathy, lumbosacral region: Secondary | ICD-10-CM | POA: Diagnosis not present

## 2014-09-18 DIAGNOSIS — M5416 Radiculopathy, lumbar region: Secondary | ICD-10-CM | POA: Diagnosis not present

## 2014-09-25 ENCOUNTER — Other Ambulatory Visit (HOSPITAL_COMMUNITY): Payer: Self-pay | Admitting: Interventional Radiology

## 2014-09-25 DIAGNOSIS — I671 Cerebral aneurysm, nonruptured: Secondary | ICD-10-CM

## 2014-09-27 ENCOUNTER — Other Ambulatory Visit: Payer: Self-pay | Admitting: Radiology

## 2014-09-27 NOTE — Progress Notes (Addendum)
Patient ID: [name] [name], female   DOB: 04/19/1946, 69 y.o.   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 She said she would "just keep appt"  Asked her to reconsider She hung up before answer.

## 2014-10-07 ENCOUNTER — Ambulatory Visit (HOSPITAL_COMMUNITY): Admission: RE | Admit: 2014-10-07 | Payer: Medicare Other | Source: Ambulatory Visit

## 2014-10-07 ENCOUNTER — Ambulatory Visit (HOSPITAL_COMMUNITY): Payer: Medicare Other

## 2014-10-09 ENCOUNTER — Ambulatory Visit (HOSPITAL_COMMUNITY): Admission: RE | Admit: 2014-10-09 | Payer: Medicare Other | Source: Ambulatory Visit

## 2014-10-16 DIAGNOSIS — M79609 Pain in unspecified limb: Secondary | ICD-10-CM | POA: Diagnosis not present

## 2014-10-16 DIAGNOSIS — M169 Osteoarthritis of hip, unspecified: Secondary | ICD-10-CM | POA: Diagnosis not present

## 2014-10-16 DIAGNOSIS — M5416 Radiculopathy, lumbar region: Secondary | ICD-10-CM | POA: Diagnosis not present

## 2014-10-16 DIAGNOSIS — M47817 Spondylosis without myelopathy or radiculopathy, lumbosacral region: Secondary | ICD-10-CM | POA: Diagnosis not present

## 2014-10-21 ENCOUNTER — Other Ambulatory Visit: Payer: Self-pay | Admitting: Pain Medicine

## 2014-10-27 DIAGNOSIS — H61001 Unspecified perichondritis of right external ear: Secondary | ICD-10-CM | POA: Diagnosis not present

## 2014-10-27 DIAGNOSIS — H6123 Impacted cerumen, bilateral: Secondary | ICD-10-CM | POA: Diagnosis not present

## 2014-11-06 DIAGNOSIS — M169 Osteoarthritis of hip, unspecified: Secondary | ICD-10-CM | POA: Diagnosis not present

## 2014-11-06 DIAGNOSIS — M47817 Spondylosis without myelopathy or radiculopathy, lumbosacral region: Secondary | ICD-10-CM | POA: Diagnosis not present

## 2014-11-06 DIAGNOSIS — M5416 Radiculopathy, lumbar region: Secondary | ICD-10-CM | POA: Diagnosis not present

## 2014-11-12 DIAGNOSIS — M810 Age-related osteoporosis without current pathological fracture: Secondary | ICD-10-CM | POA: Diagnosis not present

## 2014-11-12 DIAGNOSIS — N760 Acute vaginitis: Secondary | ICD-10-CM | POA: Diagnosis not present

## 2014-11-12 DIAGNOSIS — F419 Anxiety disorder, unspecified: Secondary | ICD-10-CM | POA: Diagnosis not present

## 2014-11-12 DIAGNOSIS — M150 Primary generalized (osteo)arthritis: Secondary | ICD-10-CM | POA: Diagnosis not present

## 2014-11-12 DIAGNOSIS — Z13820 Encounter for screening for osteoporosis: Secondary | ICD-10-CM | POA: Diagnosis not present

## 2014-11-13 DIAGNOSIS — M169 Osteoarthritis of hip, unspecified: Secondary | ICD-10-CM | POA: Diagnosis not present

## 2014-11-13 DIAGNOSIS — M961 Postlaminectomy syndrome, not elsewhere classified: Secondary | ICD-10-CM | POA: Diagnosis not present

## 2014-11-13 DIAGNOSIS — M47812 Spondylosis without myelopathy or radiculopathy, cervical region: Secondary | ICD-10-CM | POA: Diagnosis not present

## 2014-11-14 ENCOUNTER — Other Ambulatory Visit: Payer: Self-pay | Admitting: Pain Medicine

## 2014-12-09 ENCOUNTER — Telehealth (HOSPITAL_COMMUNITY): Payer: Self-pay

## 2014-12-09 NOTE — Telephone Encounter (Signed)
Called pt to reschedule MRI/MRA, no answer..left message for pt to call back. AW

## 2014-12-18 DIAGNOSIS — M7060 Trochanteric bursitis, unspecified hip: Secondary | ICD-10-CM | POA: Diagnosis not present

## 2014-12-18 DIAGNOSIS — M5416 Radiculopathy, lumbar region: Secondary | ICD-10-CM | POA: Diagnosis not present

## 2014-12-18 DIAGNOSIS — M47817 Spondylosis without myelopathy or radiculopathy, lumbosacral region: Secondary | ICD-10-CM | POA: Diagnosis not present

## 2014-12-26 ENCOUNTER — Other Ambulatory Visit: Payer: Self-pay | Admitting: Pain Medicine

## 2015-01-05 DIAGNOSIS — N952 Postmenopausal atrophic vaginitis: Secondary | ICD-10-CM | POA: Diagnosis not present

## 2015-01-15 DIAGNOSIS — M5137 Other intervertebral disc degeneration, lumbosacral region: Secondary | ICD-10-CM | POA: Diagnosis not present

## 2015-01-15 DIAGNOSIS — M5416 Radiculopathy, lumbar region: Secondary | ICD-10-CM | POA: Diagnosis not present

## 2015-01-15 DIAGNOSIS — M47817 Spondylosis without myelopathy or radiculopathy, lumbosacral region: Secondary | ICD-10-CM | POA: Diagnosis not present

## 2015-02-11 DIAGNOSIS — M25551 Pain in right hip: Secondary | ICD-10-CM | POA: Diagnosis not present

## 2015-02-11 DIAGNOSIS — M810 Age-related osteoporosis without current pathological fracture: Secondary | ICD-10-CM | POA: Diagnosis not present

## 2015-02-11 DIAGNOSIS — F419 Anxiety disorder, unspecified: Secondary | ICD-10-CM | POA: Diagnosis not present

## 2015-02-11 DIAGNOSIS — N760 Acute vaginitis: Secondary | ICD-10-CM | POA: Diagnosis not present

## 2015-02-12 DIAGNOSIS — M47817 Spondylosis without myelopathy or radiculopathy, lumbosacral region: Secondary | ICD-10-CM | POA: Diagnosis not present

## 2015-02-12 DIAGNOSIS — M7060 Trochanteric bursitis, unspecified hip: Secondary | ICD-10-CM | POA: Diagnosis not present

## 2015-02-12 DIAGNOSIS — M5416 Radiculopathy, lumbar region: Secondary | ICD-10-CM | POA: Diagnosis not present

## 2015-03-18 ENCOUNTER — Telehealth (HOSPITAL_COMMUNITY): Payer: Self-pay | Admitting: Interventional Radiology

## 2015-03-18 DIAGNOSIS — H9202 Otalgia, left ear: Secondary | ICD-10-CM | POA: Diagnosis not present

## 2015-03-18 DIAGNOSIS — H60332 Swimmer's ear, left ear: Secondary | ICD-10-CM | POA: Diagnosis not present

## 2015-03-18 NOTE — Telephone Encounter (Signed)
Called pt to see if she wanted to reschedule her routine f/u MRI/MRA. She asked me to call her back again next week and she would let me know. Pt has displayed poor compliance with her f/u. JM

## 2015-03-19 DIAGNOSIS — M47817 Spondylosis without myelopathy or radiculopathy, lumbosacral region: Secondary | ICD-10-CM | POA: Diagnosis not present

## 2015-03-19 DIAGNOSIS — M169 Osteoarthritis of hip, unspecified: Secondary | ICD-10-CM | POA: Diagnosis not present

## 2015-03-19 DIAGNOSIS — M5416 Radiculopathy, lumbar region: Secondary | ICD-10-CM | POA: Diagnosis not present

## 2015-04-06 DIAGNOSIS — Z716 Tobacco abuse counseling: Secondary | ICD-10-CM | POA: Diagnosis not present

## 2015-04-06 DIAGNOSIS — F17200 Nicotine dependence, unspecified, uncomplicated: Secondary | ICD-10-CM | POA: Diagnosis not present

## 2015-04-06 DIAGNOSIS — F419 Anxiety disorder, unspecified: Secondary | ICD-10-CM | POA: Diagnosis not present

## 2015-04-16 ENCOUNTER — Telehealth (HOSPITAL_COMMUNITY): Payer: Self-pay

## 2015-04-16 DIAGNOSIS — Z79891 Long term (current) use of opiate analgesic: Secondary | ICD-10-CM | POA: Diagnosis not present

## 2015-04-16 DIAGNOSIS — M5416 Radiculopathy, lumbar region: Secondary | ICD-10-CM | POA: Diagnosis not present

## 2015-04-16 DIAGNOSIS — M7060 Trochanteric bursitis, unspecified hip: Secondary | ICD-10-CM | POA: Diagnosis not present

## 2015-04-16 DIAGNOSIS — M47817 Spondylosis without myelopathy or radiculopathy, lumbosacral region: Secondary | ICD-10-CM | POA: Diagnosis not present

## 2015-04-16 NOTE — Telephone Encounter (Signed)
Called to schedule MRI, left message for pt to call back. AW

## 2015-05-05 ENCOUNTER — Ambulatory Visit (HOSPITAL_COMMUNITY): Admission: RE | Admit: 2015-05-05 | Payer: Medicare Other | Source: Ambulatory Visit

## 2015-05-05 ENCOUNTER — Ambulatory Visit (HOSPITAL_COMMUNITY): Payer: Medicare Other | Attending: Interventional Radiology

## 2015-05-11 DIAGNOSIS — J449 Chronic obstructive pulmonary disease, unspecified: Secondary | ICD-10-CM | POA: Diagnosis not present

## 2015-05-11 DIAGNOSIS — F419 Anxiety disorder, unspecified: Secondary | ICD-10-CM | POA: Diagnosis not present

## 2015-05-14 DIAGNOSIS — M47817 Spondylosis without myelopathy or radiculopathy, lumbosacral region: Secondary | ICD-10-CM | POA: Diagnosis not present

## 2015-05-14 DIAGNOSIS — M5416 Radiculopathy, lumbar region: Secondary | ICD-10-CM | POA: Diagnosis not present

## 2015-05-14 DIAGNOSIS — M7060 Trochanteric bursitis, unspecified hip: Secondary | ICD-10-CM | POA: Diagnosis not present

## 2015-06-07 DEATH — deceased

## 2015-06-30 ENCOUNTER — Telehealth (HOSPITAL_COMMUNITY): Payer: Self-pay

## 2015-06-30 NOTE — Telephone Encounter (Signed)
Called to schedule MRI, left message for pt to return call. AW 

## 2015-07-09 ENCOUNTER — Telehealth: Payer: Self-pay

## 2017-04-21 NOTE — Telephone Encounter (Signed)
Done
